# Patient Record
Sex: Male | Born: 1965
Health system: Southern US, Community
[De-identification: ages and names within clinical notes are randomized; demographics above are authoritative.]

## PROBLEM LIST (undated history)

## (undated) DIAGNOSIS — G4730 Sleep apnea, unspecified: Secondary | ICD-10-CM

## (undated) DIAGNOSIS — R112 Nausea with vomiting, unspecified: Secondary | ICD-10-CM

## (undated) DIAGNOSIS — Z9889 Other specified postprocedural states: Secondary | ICD-10-CM

## (undated) DIAGNOSIS — T8859XA Other complications of anesthesia, initial encounter: Secondary | ICD-10-CM

## (undated) DIAGNOSIS — I10 Essential (primary) hypertension: Secondary | ICD-10-CM

## (undated) DIAGNOSIS — Z8489 Family history of other specified conditions: Secondary | ICD-10-CM

## (undated) DIAGNOSIS — N189 Chronic kidney disease, unspecified: Secondary | ICD-10-CM

## (undated) DIAGNOSIS — T4145XA Adverse effect of unspecified anesthetic, initial encounter: Secondary | ICD-10-CM

## (undated) DIAGNOSIS — R0789 Other chest pain: Secondary | ICD-10-CM

## (undated) HISTORY — DX: Sleep apnea, unspecified: G47.30

## (undated) HISTORY — PX: HAND SURGERY: SHX662

## (undated) HISTORY — DX: Essential (primary) hypertension: I10

## (undated) HISTORY — DX: Other chest pain: R07.89

---

## 2014-12-28 ENCOUNTER — Other Ambulatory Visit (INDEPENDENT_AMBULATORY_CARE_PROVIDER_SITE_OTHER): Payer: Self-pay

## 2014-12-28 DIAGNOSIS — Z01818 Encounter for other preprocedural examination: Secondary | ICD-10-CM

## 2014-12-28 DIAGNOSIS — G4733 Obstructive sleep apnea (adult) (pediatric): Secondary | ICD-10-CM

## 2014-12-28 DIAGNOSIS — I10 Essential (primary) hypertension: Secondary | ICD-10-CM

## 2014-12-28 DIAGNOSIS — K219 Gastro-esophageal reflux disease without esophagitis: Secondary | ICD-10-CM

## 2015-02-08 ENCOUNTER — Ambulatory Visit (HOSPITAL_COMMUNITY)
Admission: RE | Admit: 2015-02-08 | Discharge: 2015-02-08 | Disposition: A | Discharge status: Home / Self Care | Payer: BLUE CROSS/BLUE SHIELD | Source: Ambulatory Visit | Attending: General Surgery | Admitting: General Surgery

## 2015-02-08 ENCOUNTER — Other Ambulatory Visit: Payer: Self-pay

## 2015-02-08 DIAGNOSIS — R160 Hepatomegaly, not elsewhere classified: Secondary | ICD-10-CM | POA: Insufficient documentation

## 2015-02-08 DIAGNOSIS — Z01811 Encounter for preprocedural respiratory examination: Secondary | ICD-10-CM | POA: Insufficient documentation

## 2015-02-08 DIAGNOSIS — Z6841 Body Mass Index (BMI) 40.0 and over, adult: Secondary | ICD-10-CM | POA: Diagnosis not present

## 2015-02-09 ENCOUNTER — Other Ambulatory Visit: Payer: Self-pay | Admitting: General Surgery

## 2015-02-09 ENCOUNTER — Other Ambulatory Visit: Payer: Self-pay

## 2015-02-09 DIAGNOSIS — R160 Hepatomegaly, not elsewhere classified: Secondary | ICD-10-CM

## 2015-02-09 NOTE — Addendum Note (Signed)
Addended by: Glenna FellowsHOXWORTH, Amram Maya T on: 02/09/2015 09:53 AM   Modules accepted: Orders

## 2015-02-13 ENCOUNTER — Ambulatory Visit (HOSPITAL_COMMUNITY)
Admission: RE | Admit: 2015-02-13 | Discharge: 2015-02-13 | Disposition: A | Discharge status: Home / Self Care | Payer: BLUE CROSS/BLUE SHIELD | Source: Ambulatory Visit | Attending: General Surgery | Admitting: General Surgery

## 2015-02-13 ENCOUNTER — Encounter: Payer: BLUE CROSS/BLUE SHIELD | Attending: General Surgery | Admitting: Dietician

## 2015-02-13 ENCOUNTER — Encounter (HOSPITAL_COMMUNITY)
Admission: RE | Disposition: A | Discharge status: Home / Self Care | Payer: Self-pay | Source: Ambulatory Visit | Attending: General Surgery

## 2015-02-13 ENCOUNTER — Encounter: Payer: Self-pay | Admitting: Dietician

## 2015-02-13 DIAGNOSIS — Z713 Dietary counseling and surveillance: Secondary | ICD-10-CM | POA: Diagnosis not present

## 2015-02-13 DIAGNOSIS — Z6841 Body Mass Index (BMI) 40.0 and over, adult: Secondary | ICD-10-CM | POA: Insufficient documentation

## 2015-02-13 HISTORY — PX: BREATH TEK H PYLORI: SHX5422

## 2015-02-13 SURGERY — BREATH TEST, FOR HELICOBACTER PYLORI

## 2015-02-13 NOTE — Progress Notes (Signed)
   02/13/15 0837  BREATH TEK ASSESSMENT  Referring MD Dr Glenna FellowsBenjamin Hoxworth  Time of Last PO Intake 2200  Baseline Breath At: 0805  Pranactin Given At: 0807  Post-Dose Breath At: 0822  Sample 1 2.2%  Sample 2 2.2%  Test Negative

## 2015-02-13 NOTE — Progress Notes (Signed)
  Pre-Op Assessment Visit:  Pre-Operative RYGB Surgery  Medical Nutrition Therapy:  Appt start time: 1125   End time:  1210  Patient was seen on 02/13/15 for Pre-Operative Nutrition Assessment. [facility] on 02/13/15.   Preferred Learning Style:   No preference indicated   Learning Readiness:   Ready  Handouts given during visit include:  Pre-Op Goals Bariatric Surgery Protein Shakes   During the appointment today the following Pre-Op Goals were reviewed with the patient: Maintain or lose weight as instructed by your surgeon Make healthy food choices Begin to limit portion sizes Limited concentrated sugars and fried foods Keep fat/sugar in the single digits per serving on   food labels Practice CHEWING your food  (aim for 30 chews per bite or until applesauce consistency) Practice not drinking 15 minutes before, during, and 30 minutes after each meal/snack Avoid all carbonated beverages  Avoid/limit caffeinated beverages  Avoid all sugar-sweetened beverages Consume 3 meals per day; eat every 3-5 hours Make a list of non-food related activities Aim for 64-100 ounces of FLUID daily  Aim for at least 60-80 grams of PROTEIN daily Look for a liquid protein source that contain ?15 g protein and ?5 g carbohydrate  (ex: shakes, drinks, shots)  Patient-Centered Goals: -Feeling better overall -Less medications  Scale of 1-10: confidence (10) /importance (11)  Demonstrated degree of understanding via:  Teach Back  Teaching Method Utilized:  Visual Auditory Hands on  Barriers to learning/adherence to lifestyle change: none  Patient to call the [facility] to enroll in Pre-Op and Post-Op Nutrition Education when surgery date is scheduled.

## 2015-02-13 NOTE — Progress Notes (Signed)
Samples provided and patient instructed on proper use:  Premier protein shake (vanilla - qty 1) Lot#: [id number] Exp: 09/2015  Premier protein shake (chocolate - qty 1) Lot#: [id number] Exp: 08/2015  Premier protein shake (strawberry - qty 1) Lot#: [id number] Exp: 05/2015

## 2015-02-14 ENCOUNTER — Encounter (HOSPITAL_COMMUNITY): Payer: Self-pay | Admitting: General Surgery

## 2015-03-28 ENCOUNTER — Ambulatory Visit
Admission: RE | Admit: 2015-03-28 | Discharge: 2015-03-28 | Disposition: A | Discharge status: Home / Self Care | Payer: BLUE CROSS/BLUE SHIELD | Source: Ambulatory Visit | Attending: General Surgery | Admitting: General Surgery

## 2015-06-05 ENCOUNTER — Encounter: Payer: BLUE CROSS/BLUE SHIELD | Attending: General Surgery

## 2015-06-05 DIAGNOSIS — Z6841 Body Mass Index (BMI) 40.0 and over, adult: Secondary | ICD-10-CM | POA: Insufficient documentation

## 2015-06-05 DIAGNOSIS — Z713 Dietary counseling and surveillance: Secondary | ICD-10-CM | POA: Insufficient documentation

## 2015-06-05 NOTE — Progress Notes (Signed)
  Pre-Operative Nutrition Class:  Appt start time: 8599   End time:  1830.  Patient was seen on 06/05/2015 for Pre-Operative Bariatric Surgery Education at the [facility].   Surgery date:  Surgery type: RYGB Start weight at [facility]: 346 lbs on 02/13/15 Weight today: 353 lbs  TANITA  BODY COMP RESULTS  06/05/15   BMI (kg/m^2) 49.2   Fat Mass (lbs) 157   Fat Free Mass (lbs) 196   Total Body Water (lbs) 143.5   Samples given per MNT protocol. Patient educated on appropriate usage: Celebrate multivitamin chew (orange - qty 1) Lot #: [id number] Exp: 12/2016  Premier protein shake (chocolate - qty 1) Lot #: [id number] Exp: 01/2016  Renee Pain Protein Powder (unflavored - qty 1) Lot #: [id number] Exp: 04/2016  The following the learning objectives were met by the patient during this course:  Identify Pre-Op Dietary Goals and will begin 2 weeks pre-operatively  Identify appropriate sources of fluids and proteins   State protein recommendations and appropriate sources pre and post-operatively  Identify Post-Operative Dietary Goals and will follow for 2 weeks post-operatively  Identify appropriate multivitamin and calcium sources  Describe the need for physical activity post-operatively and will follow MD recommendations  State when to call healthcare provider regarding medication questions or post-operative complications  Handouts given during class include:  Pre-Op Bariatric Surgery Diet Handout  Protein Shake Handout  Post-Op Bariatric Surgery Nutrition Handout  BELT Program Information Flyer  Support Group Information Flyer  [facility] Bariatric Supplements Price List  Follow-Up Plan: Patient will follow-up at [facility] 2 weeks post operatively for diet advancement per MD.

## 2015-06-16 ENCOUNTER — Other Ambulatory Visit: Payer: Self-pay | Admitting: General Surgery

## 2015-06-26 ENCOUNTER — Other Ambulatory Visit: Payer: Self-pay | Admitting: General Surgery

## 2015-06-28 ENCOUNTER — Other Ambulatory Visit (HOSPITAL_COMMUNITY): Payer: Self-pay | Admitting: *Deleted

## 2015-06-28 NOTE — Progress Notes (Signed)
Chest xray 02-08-15 epic

## 2015-06-28 NOTE — Patient Instructions (Addendum)
James Patton  06/28/2015   Your procedure is scheduled on: Tuesday sept 6, 2016  Report to [facility]  Entrance take [facility]  elevators to 3rd floor to  Short Stay Center at 530 AM.  Call this number if you have problems the morning of surgery [phone]   Remember: ONLY 1 PERSON MAY GO WITH YOU TO SHORT STAY TO GET  READY MORNING OF YOUR SURGERY.  Do not eat food or drink liquids :After Midnight.  Bring cpap mask and tubing   Take these medicines the morning of surgery with A SIP OF WATER: Amlodipine, (Norvasc), Labetalol (Normodine)                               You may not have any metal on your body including hair pins and              piercings  Do not wear jewelry, make-up, lotions, powders or perfumes, deodorant             Do not wear nail polish.  Do not shave  48 hours prior to surgery.              Men may shave face and neck.   Do not bring valuables to the hospital. [place] IS NOT             RESPONSIBLE   FOR VALUABLES.  Contacts, dentures or bridgework may not be worn into surgery.  Leave suitcase in the car. After surgery it may be brought to your room.     Patients discharged the day of surgery will not be allowed to drive home.  Name and phone number of your driver:  Special Instructions: N/A              Please read over the following fact sheets you were given: _____________________________________________________________________             [facility] - Preparing for Surgery Before surgery, you can play an important role.  Because skin is not sterile, your skin needs to be as free of germs as possible.  You can reduce the number of germs on your skin by washing with CHG (chlorahexidine gluconate) soap before surgery.  CHG is an antiseptic cleaner which kills germs and bonds with the skin to continue killing germs even after washing. Please DO NOT use if you have an allergy to CHG or antibacterial soaps.  If your skin becomes  reddened/irritated stop using the CHG and inform your nurse when you arrive at Short Stay. Do not shave (including legs and underarms) for at least 48 hours prior to the first CHG shower.  You may shave your face/neck. Please follow these instructions carefully:  1.  Shower with CHG Soap the night before surgery and the  morning of Surgery.  2.  If you choose to wash your hair, wash your hair first as usual with your  normal  shampoo.  3.  After you shampoo, rinse your hair and body thoroughly to remove the  shampoo.                           4.  Use CHG as you would any other liquid soap.  You can apply chg directly  to the skin and wash  Gently with a scrungie or clean washcloth.  5.  Apply the CHG Soap to your body ONLY FROM THE NECK DOWN.   Do not use on face/ open                           Wound or open sores. Avoid contact with eyes, ears mouth and genitals (private parts).                       Wash face,  Genitals (private parts) with your normal soap.             6.  Wash thoroughly, paying special attention to the area where your surgery  will be performed.  7.  Thoroughly rinse your body with warm water from the neck down.  8.  DO NOT shower/wash with your normal soap after using and rinsing off  the CHG Soap.                9.  Pat yourself dry with a clean towel.            10.  Wear clean pajamas.            11.  Place clean sheets on your bed the night of your first shower and do not  sleep with pets. Day of Surgery : Do not apply any lotions/deodorants the morning of surgery.  Please wear clean clothes to the hospital/surgery center.  FAILURE TO FOLLOW THESE INSTRUCTIONS MAY RESULT IN THE CANCELLATION OF YOUR SURGERY PATIENT SIGNATURE_________________________________  NURSE SIGNATURE__________________________________  ________________________________________________________________________

## 2015-06-28 NOTE — Progress Notes (Signed)
Spoke with dr Leta Jungling and made aware ekg 02-08-15 results, order received to repeat ekg with pre op 06-29-15.

## 2015-06-29 ENCOUNTER — Encounter (HOSPITAL_COMMUNITY): Payer: Self-pay

## 2015-06-29 ENCOUNTER — Encounter (HOSPITAL_COMMUNITY)
Admission: RE | Admit: 2015-06-29 | Discharge: 2015-06-29 | Disposition: A | Discharge status: Home / Self Care | Payer: BLUE CROSS/BLUE SHIELD | Source: Ambulatory Visit | Attending: General Surgery | Admitting: General Surgery

## 2015-06-29 DIAGNOSIS — Z01810 Encounter for preprocedural cardiovascular examination: Secondary | ICD-10-CM | POA: Diagnosis present

## 2015-06-29 DIAGNOSIS — Z6841 Body Mass Index (BMI) 40.0 and over, adult: Secondary | ICD-10-CM | POA: Insufficient documentation

## 2015-06-29 DIAGNOSIS — Z01812 Encounter for preprocedural laboratory examination: Secondary | ICD-10-CM | POA: Diagnosis present

## 2015-06-29 HISTORY — DX: Other complications of anesthesia, initial encounter: T88.59XA

## 2015-06-29 HISTORY — DX: Nausea with vomiting, unspecified: Z98.890

## 2015-06-29 HISTORY — DX: Other specified postprocedural states: R11.2

## 2015-06-29 HISTORY — DX: Chronic kidney disease, unspecified: N18.9

## 2015-06-29 HISTORY — DX: Adverse effect of unspecified anesthetic, initial encounter: T41.45XA

## 2015-06-29 HISTORY — DX: Family history of other specified conditions: Z84.89

## 2015-06-29 LAB — CBC WITH DIFFERENTIAL/PLATELET
Basophils Absolute: 0 10*3/uL (ref 0.0–0.1)
Basophils Relative: 0 % (ref 0–1)
Eosinophils Absolute: 0.2 10*3/uL (ref 0.0–0.7)
Eosinophils Relative: 3 % (ref 0–5)
HCT: 49.5 % (ref 39.0–52.0)
Hemoglobin: 16.7 g/dL (ref 13.0–17.0)
Lymphocytes Relative: 38 % (ref 12–46)
Lymphs Abs: 2.8 10*3/uL (ref 0.7–4.0)
MCH: 31.5 pg (ref 26.0–34.0)
MCHC: 33.7 g/dL (ref 30.0–36.0)
MCV: 93.2 fL (ref 78.0–100.0)
Monocytes Absolute: 0.5 10*3/uL (ref 0.1–1.0)
Monocytes Relative: 7 % (ref 3–12)
Neutro Abs: 3.7 10*3/uL (ref 1.7–7.7)
Neutrophils Relative %: 52 % (ref 43–77)
Platelets: 288 10*3/uL (ref 150–400)
RBC: 5.31 MIL/uL (ref 4.22–5.81)
RDW: 14.5 % (ref 11.5–15.5)
WBC: 7.2 10*3/uL (ref 4.0–10.5)

## 2015-06-29 LAB — COMPREHENSIVE METABOLIC PANEL
ALT: 19 U/L (ref 17–63)
AST: 27 U/L (ref 15–41)
Albumin: 4.2 g/dL (ref 3.5–5.0)
Alkaline Phosphatase: 51 U/L (ref 38–126)
Anion gap: 8 (ref 5–15)
BUN: 27 mg/dL — ABNORMAL HIGH (ref 6–20)
CO2: 28 mmol/L (ref 22–32)
Calcium: 9.3 mg/dL (ref 8.9–10.3)
Chloride: 103 mmol/L (ref 101–111)
Creatinine, Ser: 1.68 mg/dL — ABNORMAL HIGH (ref 0.61–1.24)
GFR calc Af Amer: 54 mL/min — ABNORMAL LOW (ref >60)
GFR calc non Af Amer: 47 mL/min — ABNORMAL LOW (ref >60)
Glucose, Bld: 91 mg/dL (ref 65–99)
Potassium: 3.6 mmol/L (ref 3.5–5.1)
Sodium: 139 mmol/L (ref 135–145)
Total Bilirubin: 0.7 mg/dL (ref 0.3–1.2)
Total Protein: 7.6 g/dL (ref 6.5–8.1)

## 2015-06-30 NOTE — Progress Notes (Signed)
Made dr Acey Lav aware ekg results from 06-29-15 and 02-08-15, pt ekg ok for surgery 07-04-15 per dr Acey Lav

## 2015-07-04 ENCOUNTER — Encounter (HOSPITAL_COMMUNITY)
Admission: AD | Disposition: A | Discharge status: Home / Self Care | Payer: Self-pay | Source: Ambulatory Visit | Attending: General Surgery

## 2015-07-04 ENCOUNTER — Inpatient Hospital Stay (HOSPITAL_COMMUNITY)
Admission: AD | Admit: 2015-07-04 | Discharge: 2015-07-06 | DRG: 621 | Disposition: A | Discharge status: Home / Self Care | Payer: BLUE CROSS/BLUE SHIELD | Source: Ambulatory Visit | Attending: General Surgery | Admitting: General Surgery

## 2015-07-04 ENCOUNTER — Inpatient Hospital Stay: Admit: 2015-07-04 | Payer: Self-pay | Admitting: General Surgery

## 2015-07-04 ENCOUNTER — Inpatient Hospital Stay (HOSPITAL_COMMUNITY): Payer: BLUE CROSS/BLUE SHIELD | Admitting: Certified Registered Nurse Anesthetist

## 2015-07-04 ENCOUNTER — Encounter (HOSPITAL_COMMUNITY): Payer: Self-pay | Admitting: *Deleted

## 2015-07-04 DIAGNOSIS — K219 Gastro-esophageal reflux disease without esophagitis: Secondary | ICD-10-CM | POA: Diagnosis present

## 2015-07-04 DIAGNOSIS — K449 Diaphragmatic hernia without obstruction or gangrene: Secondary | ICD-10-CM | POA: Diagnosis present

## 2015-07-04 DIAGNOSIS — Z01812 Encounter for preprocedural laboratory examination: Secondary | ICD-10-CM | POA: Diagnosis not present

## 2015-07-04 DIAGNOSIS — G4733 Obstructive sleep apnea (adult) (pediatric): Secondary | ICD-10-CM | POA: Diagnosis present

## 2015-07-04 DIAGNOSIS — I10 Essential (primary) hypertension: Secondary | ICD-10-CM | POA: Diagnosis present

## 2015-07-04 DIAGNOSIS — F40240 Claustrophobia: Secondary | ICD-10-CM | POA: Diagnosis present

## 2015-07-04 DIAGNOSIS — Z6841 Body Mass Index (BMI) 40.0 and over, adult: Secondary | ICD-10-CM | POA: Diagnosis not present

## 2015-07-04 DIAGNOSIS — Z79899 Other long term (current) drug therapy: Secondary | ICD-10-CM

## 2015-07-04 HISTORY — PX: LAPAROSCOPIC GASTRIC SLEEVE RESECTION: SHX5895

## 2015-07-04 LAB — HEMOGLOBIN AND HEMATOCRIT, BLOOD
HCT: 52.7 % — ABNORMAL HIGH (ref 39.0–52.0)
Hemoglobin: 18.1 g/dL — ABNORMAL HIGH (ref 13.0–17.0)

## 2015-07-04 SURGERY — LAPAROSCOPIC ROUX-EN-Y GASTRIC BYPASS WITH UPPER ENDOSCOPY
Anesthesia: General

## 2015-07-04 SURGERY — GASTRECTOMY, SLEEVE, LAPAROSCOPIC
Anesthesia: General | Site: Abdomen

## 2015-07-04 MED ORDER — BUPIVACAINE-EPINEPHRINE (PF) 0.25% -1:200000 IJ SOLN
INTRAMUSCULAR | Status: AC
  Filled 2015-07-04: qty 30

## 2015-07-04 MED ORDER — LACTATED RINGERS IV SOLN: INTRAVENOUS | Status: DC

## 2015-07-04 MED ORDER — METOPROLOL TARTRATE 1 MG/ML IV SOLN
10.0000 mg | Freq: Four times a day (QID) | INTRAVENOUS | Status: DC
  Administered 2015-07-04 – 2015-07-06 (×8): 10 mg via INTRAVENOUS
  Filled 2015-07-04 (×15): qty 10

## 2015-07-04 MED ORDER — EVICEL 5 ML EX KIT
PACK | Freq: Once | CUTANEOUS | Status: AC
  Administered 2015-07-04: 5 mL
  Filled 2015-07-04: qty 1

## 2015-07-04 MED ORDER — GLYCOPYRROLATE 0.2 MG/ML IJ SOLN
INTRAMUSCULAR | Status: AC
  Filled 2015-07-04: qty 3

## 2015-07-04 MED ORDER — LEVOFLOXACIN IN D5W 750 MG/150ML IV SOLN
750.0000 mg | INTRAVENOUS | Status: AC
  Administered 2015-07-04: 750 mg via INTRAVENOUS
  Filled 2015-07-04: qty 150

## 2015-07-04 MED ORDER — MEPERIDINE HCL 50 MG/ML IJ SOLN: 6.2500 mg | INTRAMUSCULAR | Status: DC | PRN

## 2015-07-04 MED ORDER — MORPHINE SULFATE (PF) 2 MG/ML IV SOLN
2.0000 mg | INTRAVENOUS | Status: DC | PRN
  Administered 2015-07-04 (×4): 2 mg via INTRAVENOUS
  Administered 2015-07-04 – 2015-07-05 (×3): 4 mg via INTRAVENOUS
  Filled 2015-07-04: qty 1
  Filled 2015-07-04: qty 2
  Filled 2015-07-04: qty 1
  Filled 2015-07-04 (×3): qty 2

## 2015-07-04 MED ORDER — PROPOFOL 10 MG/ML IV BOLUS
INTRAVENOUS | Status: AC
  Filled 2015-07-04: qty 20

## 2015-07-04 MED ORDER — ACETAMINOPHEN 160 MG/5ML PO SOLN: 325.0000 mg | ORAL | Status: DC | PRN

## 2015-07-04 MED ORDER — LACTATED RINGERS IR SOLN
Status: DC | PRN
  Administered 2015-07-04: 1000 mL

## 2015-07-04 MED ORDER — ACETAMINOPHEN 160 MG/5ML PO SOLN: 650.0000 mg | ORAL | Status: DC | PRN

## 2015-07-04 MED ORDER — SODIUM CHLORIDE 0.9 % IJ SOLN
INTRAMUSCULAR | Status: AC
  Filled 2015-07-04: qty 20

## 2015-07-04 MED ORDER — ACETAMINOPHEN 10 MG/ML IV SOLN
1000.0000 mg | Freq: Four times a day (QID) | INTRAVENOUS | Status: AC
  Administered 2015-07-04 – 2015-07-05 (×4): 1000 mg via INTRAVENOUS
  Filled 2015-07-04 (×5): qty 100

## 2015-07-04 MED ORDER — ONDANSETRON HCL 4 MG/2ML IJ SOLN
INTRAMUSCULAR | Status: DC | PRN
  Administered 2015-07-04: 4 mg via INTRAVENOUS

## 2015-07-04 MED ORDER — GLYCOPYRROLATE 0.2 MG/ML IJ SOLN
INTRAMUSCULAR | Status: DC | PRN
  Administered 2015-07-04: .8 mg via INTRAVENOUS

## 2015-07-04 MED ORDER — UNJURY CHICKEN SOUP POWDER: 2.0000 [oz_av] | Freq: Four times a day (QID) | ORAL | Status: DC

## 2015-07-04 MED ORDER — UNJURY VANILLA POWDER: 2.0000 [oz_av] | Freq: Four times a day (QID) | ORAL | Status: DC

## 2015-07-04 MED ORDER — ROCURONIUM BROMIDE 100 MG/10ML IV SOLN
INTRAVENOUS | Status: DC | PRN
  Administered 2015-07-04 (×2): 10 mg via INTRAVENOUS
  Administered 2015-07-04: 50 mg via INTRAVENOUS

## 2015-07-04 MED ORDER — CHLORHEXIDINE GLUCONATE CLOTH 2 % EX PADS: 6.0000 | MEDICATED_PAD | Freq: Once | CUTANEOUS | Status: DC

## 2015-07-04 MED ORDER — 0.9 % SODIUM CHLORIDE (POUR BTL) OPTIME
TOPICAL | Status: DC | PRN
  Administered 2015-07-04: 1000 mL

## 2015-07-04 MED ORDER — LIDOCAINE HCL (CARDIAC) 20 MG/ML IV SOLN
INTRAVENOUS | Status: DC | PRN
Start: 2015-07-04 — End: 2015-07-04
  Administered 2015-07-04: 100 mg via INTRAVENOUS

## 2015-07-04 MED ORDER — LIDOCAINE HCL (CARDIAC) 20 MG/ML IV SOLN
INTRAVENOUS | Status: AC
  Filled 2015-07-04: qty 5

## 2015-07-04 MED ORDER — LOTEPREDNOL ETABONATE 0.5 % OP GEL
1.0000 "application " | Freq: Every day | OPHTHALMIC | Status: DC
  Administered 2015-07-06: 1 via OPHTHALMIC

## 2015-07-04 MED ORDER — ONDANSETRON HCL 4 MG/2ML IJ SOLN
INTRAMUSCULAR | Status: AC
  Filled 2015-07-04: qty 2

## 2015-07-04 MED ORDER — NEOSTIGMINE METHYLSULFATE 10 MG/10ML IV SOLN
INTRAVENOUS | Status: DC | PRN
  Administered 2015-07-04: 5 mg via INTRAVENOUS

## 2015-07-04 MED ORDER — EPHEDRINE SULFATE 50 MG/ML IJ SOLN
INTRAMUSCULAR | Status: AC
  Filled 2015-07-04: qty 1

## 2015-07-04 MED ORDER — FENTANYL CITRATE (PF) 250 MCG/5ML IJ SOLN
INTRAMUSCULAR | Status: AC
  Filled 2015-07-04: qty 25

## 2015-07-04 MED ORDER — LACTATED RINGERS IV SOLN
INTRAVENOUS | Status: DC | PRN
  Administered 2015-07-04 (×2): via INTRAVENOUS

## 2015-07-04 MED ORDER — PANTOPRAZOLE SODIUM 40 MG IV SOLR
40.0000 mg | Freq: Every day | INTRAVENOUS | Status: DC
  Administered 2015-07-04 – 2015-07-05 (×2): 40 mg via INTRAVENOUS
  Filled 2015-07-04 (×3): qty 40

## 2015-07-04 MED ORDER — BUPIVACAINE-EPINEPHRINE 0.25% -1:200000 IJ SOLN
INTRAMUSCULAR | Status: DC | PRN
  Administered 2015-07-04: 29 mL

## 2015-07-04 MED ORDER — ROCURONIUM BROMIDE 100 MG/10ML IV SOLN
INTRAVENOUS | Status: AC
  Filled 2015-07-04: qty 1

## 2015-07-04 MED ORDER — ENOXAPARIN SODIUM 30 MG/0.3ML ~~LOC~~ SOLN
30.0000 mg | Freq: Two times a day (BID) | SUBCUTANEOUS | Status: DC
  Administered 2015-07-05 – 2015-07-06 (×3): 30 mg via SUBCUTANEOUS
  Filled 2015-07-04 (×5): qty 0.3

## 2015-07-04 MED ORDER — SUCCINYLCHOLINE CHLORIDE 20 MG/ML IJ SOLN
INTRAMUSCULAR | Status: DC | PRN
  Administered 2015-07-04: 160 mg via INTRAVENOUS

## 2015-07-04 MED ORDER — FENTANYL CITRATE (PF) 100 MCG/2ML IJ SOLN: 25.0000 ug | INTRAMUSCULAR | Status: DC | PRN

## 2015-07-04 MED ORDER — OXYCODONE HCL 5 MG/5ML PO SOLN
5.0000 mg | ORAL | Status: DC | PRN
  Administered 2015-07-05: 5 mg via ORAL
  Administered 2015-07-05 – 2015-07-06 (×2): 10 mg via ORAL
  Filled 2015-07-04: qty 10
  Filled 2015-07-04: qty 5
  Filled 2015-07-04: qty 10

## 2015-07-04 MED ORDER — UNJURY CHOCOLATE CLASSIC POWDER
2.0000 [oz_av] | Freq: Four times a day (QID) | ORAL | Status: DC
  Administered 2015-07-06: 2 [oz_av] via ORAL

## 2015-07-04 MED ORDER — MIDAZOLAM HCL 5 MG/5ML IJ SOLN
INTRAMUSCULAR | Status: DC | PRN
  Administered 2015-07-04: 2 mg via INTRAVENOUS

## 2015-07-04 MED ORDER — SODIUM CHLORIDE 0.9 % IJ SOLN
INTRAMUSCULAR | Status: DC | PRN
  Administered 2015-07-04: 20 mL

## 2015-07-04 MED ORDER — PROMETHAZINE HCL 25 MG/ML IJ SOLN: 6.2500 mg | INTRAMUSCULAR | Status: DC | PRN

## 2015-07-04 MED ORDER — TISSEEL VH 10 ML EX KIT
PACK | CUTANEOUS | Status: AC
  Filled 2015-07-04: qty 1

## 2015-07-04 MED ORDER — FENTANYL CITRATE (PF) 100 MCG/2ML IJ SOLN
INTRAMUSCULAR | Status: DC | PRN
  Administered 2015-07-04 (×5): 50 ug via INTRAVENOUS

## 2015-07-04 MED ORDER — ONDANSETRON HCL 4 MG/2ML IJ SOLN: 4.0000 mg | INTRAMUSCULAR | Status: DC | PRN

## 2015-07-04 MED ORDER — MIDAZOLAM HCL 2 MG/2ML IJ SOLN
INTRAMUSCULAR | Status: AC
  Filled 2015-07-04: qty 4

## 2015-07-04 MED ORDER — PROPOFOL 10 MG/ML IV BOLUS
INTRAVENOUS | Status: DC | PRN
  Administered 2015-07-04: 250 mg via INTRAVENOUS

## 2015-07-04 MED ORDER — NEOSTIGMINE METHYLSULFATE 10 MG/10ML IV SOLN
INTRAVENOUS | Status: AC
  Filled 2015-07-04: qty 1

## 2015-07-04 MED ORDER — ATROPINE SULFATE 0.4 MG/ML IJ SOLN
INTRAMUSCULAR | Status: AC
  Filled 2015-07-04: qty 1

## 2015-07-04 MED ORDER — PHENYLEPHRINE 40 MCG/ML (10ML) SYRINGE FOR IV PUSH (FOR BLOOD PRESSURE SUPPORT)
PREFILLED_SYRINGE | INTRAVENOUS | Status: AC
  Filled 2015-07-04: qty 10

## 2015-07-04 MED ORDER — DEXAMETHASONE SODIUM PHOSPHATE 10 MG/ML IJ SOLN
INTRAMUSCULAR | Status: AC
  Filled 2015-07-04: qty 1

## 2015-07-04 MED ORDER — HYDRALAZINE HCL 20 MG/ML IJ SOLN
20.0000 mg | INTRAMUSCULAR | Status: DC | PRN
  Administered 2015-07-04 – 2015-07-05 (×3): 20 mg via INTRAVENOUS
  Filled 2015-07-04 (×3): qty 1

## 2015-07-04 MED ORDER — SODIUM CHLORIDE 0.9 % IJ SOLN
INTRAMUSCULAR | Status: AC
  Filled 2015-07-04: qty 10

## 2015-07-04 MED ORDER — BUPIVACAINE LIPOSOME 1.3 % IJ SUSP
20.0000 mL | Freq: Once | INTRAMUSCULAR | Status: AC
  Administered 2015-07-04: 20 mL
  Filled 2015-07-04: qty 20

## 2015-07-04 MED ORDER — OXYCODONE HCL 5 MG/5ML PO SOLN: 5.0000 mg | ORAL | Status: DC | PRN

## 2015-07-04 MED ORDER — DEXAMETHASONE SODIUM PHOSPHATE 10 MG/ML IJ SOLN
INTRAMUSCULAR | Status: DC | PRN
  Administered 2015-07-04: 10 mg via INTRAVENOUS

## 2015-07-04 MED ORDER — HEPARIN SODIUM (PORCINE) 5000 UNIT/ML IJ SOLN
5000.0000 [IU] | INTRAMUSCULAR | Status: AC
  Administered 2015-07-04: 5000 [IU] via SUBCUTANEOUS
  Filled 2015-07-04: qty 1

## 2015-07-04 MED ORDER — PHENYLEPHRINE HCL 10 MG/ML IJ SOLN
INTRAMUSCULAR | Status: DC | PRN
  Administered 2015-07-04: 80 ug via INTRAVENOUS
  Administered 2015-07-04: 120 ug via INTRAVENOUS
  Administered 2015-07-04 (×2): 80 ug via INTRAVENOUS

## 2015-07-04 MED ORDER — KCL IN DEXTROSE-NACL 20-5-0.45 MEQ/L-%-% IV SOLN
INTRAVENOUS | Status: DC
  Administered 2015-07-04 – 2015-07-06 (×5): via INTRAVENOUS
  Filled 2015-07-04 (×7): qty 1000

## 2015-07-04 SURGICAL SUPPLY — 54 items
APPLICATOR COTTON TIP 6IN STRL (MISCELLANEOUS) IMPLANT
APPLIER CLIP ROT 10 11.4 M/L (STAPLE)
APPLIER CLIP ROT 13.4 12 LRG (CLIP)
BLADE SURG SZ11 CARB STEEL (BLADE) ×2 IMPLANT
CABLE HIGH FREQUENCY MONO STRZ (ELECTRODE) ×2 IMPLANT
CHLORAPREP W/TINT 26ML (MISCELLANEOUS) ×4 IMPLANT
CLIP APPLIE ROT 10 11.4 M/L (STAPLE) IMPLANT
CLIP APPLIE ROT 13.4 12 LRG (CLIP) IMPLANT
COVER SURGICAL LIGHT HANDLE (MISCELLANEOUS) ×2 IMPLANT
DEVICE SUTURE ENDOST 10MM (ENDOMECHANICALS) ×2 IMPLANT
DEVICE TROCAR PUNCTURE CLOSURE (ENDOMECHANICALS) ×2 IMPLANT
DRAPE CAMERA CLOSED 9X96 (DRAPES) ×2 IMPLANT
DRAPE UTILITY XL STRL (DRAPES) ×4 IMPLANT
ELECT REM PT RETURN 9FT ADLT (ELECTROSURGICAL) ×2
ELECTRODE REM PT RTRN 9FT ADLT (ELECTROSURGICAL) ×1 IMPLANT
GAUZE SPONGE 4X4 12PLY STRL (GAUZE/BANDAGES/DRESSINGS) IMPLANT
GLOVE BIOGEL PI IND STRL 7.5 (GLOVE) ×1 IMPLANT
GLOVE BIOGEL PI INDICATOR 7.5 (GLOVE) ×1
GLOVE ECLIPSE 7.5 STRL STRAW (GLOVE) ×2 IMPLANT
GOWN STRL REUS W/TWL XL LVL3 (GOWN DISPOSABLE) ×10 IMPLANT
HOVERMATT SINGLE USE (MISCELLANEOUS) ×2 IMPLANT
KIT BASIN OR (CUSTOM PROCEDURE TRAY) ×2 IMPLANT
LIQUID BAND (GAUZE/BANDAGES/DRESSINGS) ×2 IMPLANT
NEEDLE SPNL 22GX3.5 QUINCKE BK (NEEDLE) ×2 IMPLANT
PACK UNIVERSAL I (CUSTOM PROCEDURE TRAY) ×2 IMPLANT
PEN SKIN MARKING BROAD (MISCELLANEOUS) ×2 IMPLANT
POUCH SPECIMEN RETRIEVAL 10MM (ENDOMECHANICALS) IMPLANT
RELOAD STAPLER BLUE 60MM (STAPLE) ×2 IMPLANT
RELOAD STAPLER GOLD 60MM (STAPLE) ×1 IMPLANT
RELOAD STAPLER GREEN 60MM (STAPLE) ×3 IMPLANT
SCISSORS LAP 5X45 EPIX DISP (ENDOMECHANICALS) ×2 IMPLANT
SEALANT SURGICAL APPL DUAL CAN (MISCELLANEOUS) ×2 IMPLANT
SET IRRIG TUBING LAPAROSCOPIC (IRRIGATION / IRRIGATOR) ×2 IMPLANT
SHEARS CURVED HARMONIC AC 45CM (MISCELLANEOUS) ×2 IMPLANT
SLEEVE ADV FIXATION 5X100MM (TROCAR) ×2 IMPLANT
SLEEVE GASTRECTOMY 36FR VISIGI (MISCELLANEOUS) ×2 IMPLANT
SOLUTION ANTI FOG 6CC (MISCELLANEOUS) ×2 IMPLANT
SPONGE LAP 18X18 X RAY DECT (DISPOSABLE) ×2 IMPLANT
STAPLER ECHELON LONG 60 440 (INSTRUMENTS) ×2 IMPLANT
STAPLER RELOAD BLUE 60MM (STAPLE) ×4
STAPLER RELOAD GOLD 60MM (STAPLE) ×2
STAPLER RELOAD GREEN 60MM (STAPLE) ×6
SUT MNCRL AB 4-0 PS2 18 (SUTURE) ×2 IMPLANT
SUT VICRYL 0 TIES 12 18 (SUTURE) ×2 IMPLANT
SYR 10ML ECCENTRIC (SYRINGE) ×2 IMPLANT
SYR 20CC LL (SYRINGE) ×2 IMPLANT
TOWEL OR 17X26 10 PK STRL BLUE (TOWEL DISPOSABLE) ×2 IMPLANT
TOWEL OR NON WOVEN STRL DISP B (DISPOSABLE) ×2 IMPLANT
TROCAR ADV FIXATION 5X100MM (TROCAR) ×2 IMPLANT
TROCAR BLADELESS 15MM (ENDOMECHANICALS) ×2 IMPLANT
TROCAR XCEL NON-BLD 5MMX100MML (ENDOMECHANICALS) ×2 IMPLANT
TUBING CONNECTING 10 (TUBING) ×4 IMPLANT
TUBING ENDO SMARTCAP PENTAX (MISCELLANEOUS) ×2 IMPLANT
TUBING FILTER THERMOFLATOR (ELECTROSURGICAL) ×2 IMPLANT

## 2015-07-04 NOTE — H&P (Signed)
History of Present Illness James [name] MD; 06/28/2015 3:49 PM) The patient is a 49 year old male who presents with obesity. He returns for his preoperative visit prior to planned laparoscopic sleeve gastrectomy. The patient gives a history of progressive obesity since childhood despite multiple attempts at medical management. He has been able to lose up to 60 pounds at a time and feels significantly better but then experiences progressive weight gain. Obesity has been affecting the patient in a number of ways including worsening fatigue and difficulty with routine activities. Significant co-morbid illnesses have developed including obstructive sleep apnea, hypertension which has become somewhat more difficult to control and occasional GERD not requiring prescription medications.. The patient has been to our initial information seminar, researched surgical options thoroughly, and initially decided on gastric bypass as his wife has had good success. However after our discussion in the office he researched further and has decided to switch to sleeve gastrectomy due to the relative simplicity and slightly lower complication rate. I previously discussed with him the change and I think this is perfectly acceptable and I expect he would have good success with either operation.   Allergies [name], CMA; 06/28/2015 3:28 PM) Penicillin V Potassium *PENICILLINS*  Medication History [name], CMA; 06/28/2015 3:28 PM) OxyCODONE HCl (5MG /5ML Solution, 5-10 Milliliter Oral every four hours, as needed, Taken starting 06/28/2015) Active. Protonix (40MG  Tablet DR, 1 (one) Tablet DR Oral daily, Taken starting 06/28/2015) Active. Ondansetron (4MG  Tablet Disperse, 1 (one) Tablet Disperse Oral every six hours, as needed, Taken starting 06/28/2015) Active. AmLODIPine Besylate (5MG  Tablet, Oral) Active. Benazepril HCl (40MG  Tablet, Oral) Active. Cialis (20MG  Tablet, Oral) Active. Losartan  Potassium (100MG  Tablet, Oral) Active. Lotemax (0.5% Gel, Ophthalmic) Active. Neomycin-Polymyxin-HC (3.5-10000-1 Suspension, Otic) Active. Medications Reconciled  Vitals [name] CMA; 06/28/2015 3:28 PM) 06/28/2015 3:28 PM Weight: 350 lb Height: 71in Body Surface Area: 2.82 m Body Mass Index: 48.81 kg/m Temp.: 98.43F(Temporal)  Pulse: 88 (Regular)  BP: 138/88 (Sitting, Left Arm, Standard)    Physical Exam James Salina T. Lenea Bywater MD; 06/28/2015 3:50 PM) The physical exam findings are as follows: Note:General: Alert, morbidly obese African-American male, in no distress Skin: Warm and dry without rash or infection. HEENT: No palpable masses or thyromegaly. Sclera nonicteric. Lymph nodes: No cervical, supraclavicular, or inguinal nodes palpable. Lungs: Breath sounds clear and equal. No wheezing or increased work of breathing. Cardiovascular: Regular rate and rhythm without murmer. No JVD or edema. Peripheral pulses intact. No carotid bruits. Abdomen: Nondistended. Soft and nontender. No masses palpable. No organomegaly. No palpable hernias. Extremities: No edema or joint swelling or deformity. No chronic venous stasis changes. Neurologic: Alert and fully oriented. Gait normal. No focal weakness. Psychiatric: Normal mood and affect. Thought content appropriate with normal judgement and insight    Assessment & Plan James Salina T. Maciel Kegg MD; 06/28/2015 3:54 PM) MORBID OBESITY (278.01  E66.01) Impression: Patient with progressive morbid obesity unresponsive to multiple efforts at medical management who presents with a BMI of 49.9 and comorbidities of obstructive sleep apnea, hypertension and GERD. I believe there would be very significant medical benefit from surgical weight loss. After our discussion of surgical options he has decided to proceed with laparoscopic sleeve gastrectomy as noted above. We have discussed the nature of the problem and the risks of remaining  morbidly obese. We discussed laparoscopic sleeve gastrectomy in detail including the nature of the procedure, expected hospitalization and recovery, and major risks of anesthetic complications, bleeding, blood clots and pulmonary emboli, leakage and infection  and long-term risks of stricture, ulceration, bowel obstruction, nutritional deficiencies, worsening GERD, and failure to lose weight or weight regain. We discussed these problems could lead to death.  We reviewed the sleeve consent form line by line today and all his questions were answered. Preoperative ultrasound showed a fairly large, 8 cm liver mass, likely hemangioma and MRI was recommended. He was unable to tolerate this due to claustrophobia. He states however he did have a workup of this in [place] 2 years ago and was told it was benign. We will obtain these records but with this history I do not think this needs to delay his surgery.  I discussed with him that due to his mild GERD we would check intraoperatively for a possible hiatal hernia.

## 2015-07-04 NOTE — Transfer of Care (Signed)
Immediate Anesthesia Transfer of Care Note  Patient: James Patton  Procedure(s) Performed: Procedure(s): LAPAROSCOPIC GASTRIC SLEEVE RESECTION WITH HIATAL HERNIA REPAIR WITH UPPER ENDOSCOPY (N/A)  Patient Location: PACU  Anesthesia Type:General  Level of Consciousness:  sedated, patient cooperative and responds to stimulation  Airway & Oxygen Therapy:Patient Spontanous Breathing and Patient connected to face mask oxgen  Post-op Assessment:  Report given to PACU RN and Post -op Vital signs reviewed and stable  Post vital signs:  Reviewed and stable  Last Vitals:  Filed Vitals:   07/04/15 0959  BP: 166/100  Pulse: 70  Temp:   Resp: 18    Complications: No apparent anesthesia complications

## 2015-07-04 NOTE — Anesthesia Postprocedure Evaluation (Signed)
  Anesthesia Post-op Note  Patient: James Patton  Procedure(s) Performed: Procedure(s) (LRB): LAPAROSCOPIC GASTRIC SLEEVE RESECTION WITH HIATAL HERNIA REPAIR WITH UPPER ENDOSCOPY (N/A)  Patient Location: PACU  Anesthesia Type: General  Level of Consciousness: awake and alert   Airway and Oxygen Therapy: Patient Spontanous Breathing  Post-op Pain: mild  Post-op Assessment: Post-op Vital signs reviewed, Patient's Cardiovascular Status Stable, Respiratory Function Stable, Patent Airway and No signs of Nausea or vomiting  Last Vitals:  Filed Vitals:   07/04/15 1414  BP: 168/104  Pulse: 73  Temp: 36.7 C  Resp: 18    Post-op Vital Signs: stable   Complications: No apparent anesthesia complications

## 2015-07-04 NOTE — Op Note (Signed)
Preoperative Diagnosis: Morbid Obesity  Postoprative Diagnosis: Morbid Obesity  Procedure: Procedure(s): LAPAROSCOPIC GASTRIC SLEEVE RESECTION WITH HIATAL HERNIA REPAIR WITH UPPER ENDOSCOPY   Surgeon: Glenna Fellows T   Assistants: Feliciana Rossetti  Anesthesia:  General endotracheal anesthesia  Indications: Patient has a history of progressive morbid obesity unresponsive to medical management and presents at a BMI of 50 with comorbidities of obstructive sleep apnea, hypertension, GERD. After extensive preoperative workup and discussion detailed elsewhere we have elected proceed with laparoscopic sleeve gastrectomy for surgical treatment of his morbid obesity.    Procedure Detail:  Patient was brought to the operating room, placed in the supine position on the operating table, and general endotracheal anesthesia induced. He received preoperative IV antibiotics and subcutaneous heparin. PAS were in place. The abdomen was widely sterilely prepped and draped. Patient timeout was performed and correct procedure verified. Access was obtained with a 5 mm Optiview trocar in the left upper quadrant without difficulty and pneumoperitoneum established. Under direct vision a 5 mm trocar was placed laterally in the right upper quadrant, a 15 mm trocar in the right midabdomen at the base of the falciform ligament, a 5 mm trocar just above and to the left the umbilicus for the camera port and a 5 mm trocar laterally in the left upper quadrant. Through a 5 mm subxiphoid site the [facility] retractor was placed in the left lobe of the liver elevated with excellent exposure of the stomach and hiatus. Initially a sizing tube was passed orally into the stomach and with the balloon inflated to 10 mL were able to pull this back through the hiatus without resistance. He does have some reflux as well. I elected to proceed with hiatal hernia repair. The gastrohepatic omentum was opened with the Harmonic scalpel and the  right crus and anterior esophagus exposed. Peritoneum just anterior to the right crus was incised and careful blunt dissection was carried posteriorly to the esophagus. The esophagus and vagus nerve were bluntly dissected out and displaced anteriorly and there was seen to be a small but definite hiatal hernia. The crura were defined and a 0 Ethibond suture was placed posteriorly through the crura closing the hiatus back to a normal size. At this point the sizing tube was passed back down into the stomach and with 10 mL in the balloon and pulled up snugly against the hiatus. Following this we proceeded with the sleeve gastrectomy. 5 cm were measured proximally from the pylorus along the greater curve. Beginning here the lesser omentum was dissected just below the greater curve with the Harmonic scalpel and the lesser sac entered. Dissection continued proximally along the greater curve. Short gastric vessels were divided. The dissection progressed proximally and the fundus was dissected away from the spleen with Harmonic scalpel. Further posterior attachments were taken and the left crus completely exposed and dissection carried back down onto our hernia repair posteriorly until the stomach was completely freed along its lesser curve vasculature. A few filmy posterior attachments were taken down back toward the pylorus and we completely freed the greater curve to 5 cm from the pylorus. The VisiG gastric tube was passed orally and advanced with its tip down to the pylorus. It was positioned along the lesser curve of the stomach splayed out symmetrically and placed on suction. An initial firing of the green load 60 mm Escalon stapler was performed beginning at 5 cm from the pylorus and angling upward but staying away from the incisura. A second firing of the green load  stapler was performed up past the incisura with a little extra room around the tube at this point. Following this several firings of the gold load 60 mm  echelon stapler were performed along side of the tube working proximally with a final firing of the blue load stapler angling just outside of the esophageal fat pad and making sure there was no redundant fundus posteriorly completing the sleeve. The staple line was intact without bleeding. Under pressure from the VisiG G-tube inflated and irrigation no evidence of leak. The sleeve was desufflated and the VisiG tube removed. The sleeve appeared symmetrical with no narrowing or twisting. Upper endoscopy was then performed and there was no narrowing or bleeding and with the sleeve again insufflated under saline irrigation of leak. Evaseal Tissue seal was placed along the staple line. Trocar sites were infiltrated with Exparel local anesthesia. The fascial defect at the 15 mm trocar site was repaired with a 0 Vicryl placed via the Endo Close. The Nathanson retractor was removed under direct vision and all CO2 evacuated. Trochars removed. Skin incisions were closed with subcuticular 4-0 Monocryl and Dermabond. Sponge needle and instrument counts were correct.    Findings: As above  Estimated Blood Loss:  Minimal         Drains: none  Blood Given: none          Specimens: The greater curvature of stomach        Complications:  * No complications entered in OR log *         Disposition: PACU - hemodynamically stable.         Condition: stable

## 2015-07-04 NOTE — Anesthesia Procedure Notes (Addendum)
Procedure Name: Intubation Date/Time: 07/04/2015 7:41 AM Performed by: Epimenio Sarin Pre-anesthesia Checklist: Patient identified, Emergency Drugs available, Suction available, Patient being monitored and Timeout performed Patient Re-evaluated:Patient Re-evaluated prior to inductionOxygen Delivery Method: Circle system utilized Preoxygenation: Pre-oxygenation with 100% oxygen Intubation Type: IV induction Ventilation: Mask ventilation without difficulty and Oral airway inserted - appropriate to patient size Laryngoscope Size: Mac and 3 Grade View: Grade II Tube type: Oral Tube size: 7.5 mm Number of attempts: 1 Airway Equipment and Method: Stylet Placement Confirmation: ETT inserted through vocal cords under direct vision,  positive ETCO2 and breath sounds checked- equal and bilateral Secured at: 21 cm Tube secured with: Tape Dental Injury: Teeth and Oropharynx as per pre-operative assessment  Comments: Large tongue, pillows under shoulders for sniffing position. ETT easily passed through cords with slight downward laryngeal pressure.

## 2015-07-04 NOTE — Discharge Instructions (Signed)

## 2015-07-04 NOTE — Anesthesia Preprocedure Evaluation (Addendum)
Anesthesia Evaluation  Patient identified by MRN, date of birth, ID band Patient awake    Reviewed: Allergy & Precautions, NPO status , Patient's Chart, lab work & pertinent test results  History of Anesthesia Complications (+) PONV  Airway Mallampati: II  TM Distance: >3 FB Neck ROM: Full    Dental no notable dental hx.    Pulmonary neg pulmonary ROS, sleep apnea ,  breath sounds clear to auscultation  Pulmonary exam normal       Cardiovascular hypertension, Pt. on medications Normal cardiovascular examRhythm:Regular Rate:Normal     Neuro/Psych negative neurological ROS  negative psych ROS   GI/Hepatic negative GI ROS, Neg liver ROS,   Endo/Other  Morbid obesity  Renal/GU negative Renal ROS  negative genitourinary   Musculoskeletal negative musculoskeletal ROS (+)   Abdominal   Peds negative pediatric ROS (+)  Hematology negative hematology ROS (+)   Anesthesia Other Findings   Reproductive/Obstetrics negative OB ROS                            Anesthesia Physical Anesthesia Plan  ASA: III  Anesthesia Plan: General   Post-op Pain Management:    Induction: Intravenous  Airway Management Planned: Oral ETT  Additional Equipment:   Intra-op Plan:   Post-operative Plan: Extubation in OR  Informed Consent: I have reviewed the patients History and Physical, chart, labs and discussed the procedure including the risks, benefits and alternatives for the proposed anesthesia with the patient or authorized representative who has indicated his/her understanding and acceptance.   Dental advisory given  Plan Discussed with: CRNA  Anesthesia Plan Comments:         Anesthesia Quick Evaluation

## 2015-07-04 NOTE — Interval H&P Note (Signed)
History and Physical Interval Note:  07/04/2015 7:15 AM  James Patton  has presented today for surgery, with the diagnosis of Morbid Obesity  The various methods of treatment have been discussed with the patient and family. After consideration of risks, benefits and other options for treatment, the patient has consented to  Procedure(s): LAPAROSCOPIC GASTRIC SLEEVE RESECTION (N/A) as a surgical intervention .  The patient's history has been reviewed, patient examined, no change in status, stable for surgery.  I have reviewed the patient's chart and labs.  Questions were answered to the patient's satisfaction.     Euell Schiff T

## 2015-07-04 NOTE — Op Note (Signed)
Preoperative diagnosis: laparoscopic sleeve gastrectomy  Postoperative diagnosis: Same   Procedure: Upper endoscopy   Surgeon: Feliciana Rossetti, M.D.  Anesthesia: Gen.   Indications for procedure: This patient was undergoing a laparoscopic adjustable gastric restrictive procedure and laparoscopic sleeve gastrectomy.   Description of procedure: The endoscopy was placed in the mouth and into the oropharynx and under endoscopic vision it was advanced to the esophagogastric junction. The pouch was insufflated and no bleeding or bubbles were seen. The GEJ was identified at 48 cm from the teeth.  No bleeding or leaks were detected. The scope was withdrawn without difficulty.   Feliciana Rossetti, M.D. General, Bariatric, & Minimally Invasive Surgery [facility]

## 2015-07-05 LAB — CBC WITH DIFFERENTIAL/PLATELET
Basophils Absolute: 0 10*3/uL (ref 0.0–0.1)
Basophils Relative: 0 % (ref 0–1)
Eosinophils Absolute: 0 10*3/uL (ref 0.0–0.7)
Eosinophils Relative: 0 % (ref 0–5)
HCT: 49.2 % (ref 39.0–52.0)
Hemoglobin: 17 g/dL (ref 13.0–17.0)
Lymphocytes Relative: 11 % — ABNORMAL LOW (ref 12–46)
Lymphs Abs: 1.3 10*3/uL (ref 0.7–4.0)
MCH: 32.1 pg (ref 26.0–34.0)
MCHC: 34.6 g/dL (ref 30.0–36.0)
MCV: 92.8 fL (ref 78.0–100.0)
Monocytes Absolute: 1.2 10*3/uL — ABNORMAL HIGH (ref 0.1–1.0)
Monocytes Relative: 9 % (ref 3–12)
Neutro Abs: 9.9 10*3/uL — ABNORMAL HIGH (ref 1.7–7.7)
Neutrophils Relative %: 80 % — ABNORMAL HIGH (ref 43–77)
Platelets: 294 10*3/uL (ref 150–400)
RBC: 5.3 MIL/uL (ref 4.22–5.81)
RDW: 14.7 % (ref 11.5–15.5)
WBC: 12.3 10*3/uL — ABNORMAL HIGH (ref 4.0–10.5)

## 2015-07-05 LAB — HEMOGLOBIN AND HEMATOCRIT, BLOOD
HCT: 50.1 % (ref 39.0–52.0)
Hemoglobin: 16.9 g/dL (ref 13.0–17.0)

## 2015-07-05 NOTE — Progress Notes (Signed)
Patient ID: James Patton, male   DOB: [birth year], 49 y.o.   MRN: [id number] 1 Day Post-Op  Subjective: Denies pain, nausea or other complaints. Up walking in the hall  Objective: Vital signs in last 24 hours: Temp:  [97.8 F (36.6 C)-99.7 F (37.6 C)] 98.1 F (36.7 C) (09/07 0515) Pulse Rate:  [70-94] 84 (09/07 0515) Resp:  [16-19] 18 (09/07 0515) BP: (127-192)/(64-104) 129/80 mmHg (09/07 0515) SpO2:  [94 %-100 %] 98 % (09/07 0515) Last BM Date: 07/03/15  Intake/Output from previous day: 09/06 0701 - 09/07 0700 In: 2850 [I.V.:2750; IV Piggyback:100] Out: 1450 [Urine:1450] Intake/Output this shift: Total I/O In: 30 [P.O.:30] Out: -   General appearance: alert, cooperative and no distress GI: normal findings: soft, non-tender Incision/Wound: cclean and dry  Lab Results:   Recent Labs  07/04/15 1850 07/05/15 0412  WBC  --  12.3*  HGB 18.1* 17.0  HCT 52.7* 49.2  PLT  --  294   BMET No results for input(s): NA, K, CL, CO2, GLUCOSE, BUN, CREATININE, CALCIUM in the last 72 hours.   Studies/Results: No results found.  Anti-infectives: Anti-infectives    Start     Dose/Rate Route Frequency Ordered Stop   07/04/15 0612  levofloxacin (LEVAQUIN) IVPB 750 mg     750 mg 100 mL/hr over 90 Minutes Intravenous On call to O.R. 07/04/15 0612 07/04/15 0845      Assessment/Plan: s/p Procedure(s): LAPAROSCOPIC GASTRIC SLEEVE RESECTION WITH HIATAL HERNIA REPAIR WITH UPPER ENDOSCOPY Doing very well without apparent complication. Starting postop day 1 diet. It is patent discharge tomorrow.   LOS: 1 day    Ryliegh Mcduffey T 07/05/2015

## 2015-07-05 NOTE — Progress Notes (Signed)
Patient alert and oriented, Post op day 1.  Provided support and encouragement.  Encouraged pulmonary toilet, ambulation and small sips of liquids.  All questions answered.  Will continue to monitor. 

## 2015-07-05 NOTE — Progress Notes (Signed)
RT placed patient on CPAP. Patient setting is auto 10-20 cmH2O. Sterile water was added to water chamber for humidification. Patient is tolerating well at this time.RT will continue to monitor.

## 2015-07-05 NOTE — Progress Notes (Signed)
Pt not ready for cpap at this time. Will call RT when ready

## 2015-07-05 NOTE — Care Management Note (Signed)
Case Management Note  Patient Details  Name: James Patton MRN: [id number] Date of Birth: Jan 09, 1966  Subjective/Objective:           laparoscopic sleeve gastrectomy          Action/Plan: Discharge planning  Expected Discharge Date:                  Expected Discharge Plan:  Home/Self Care  In-House Referral:  NA  Discharge planning Services  CM Consult  Post Acute Care Choice:  NA Choice offered to:  NA  DME Arranged:  N/A DME Agency:  NA  HH Arranged:  NA HH Agency:  NA  Status of Service:  Completed, signed off  Medicare Important Message Given:    Date Medicare IM Given:    Medicare IM give by:    Date Additional Medicare IM Given:    Additional Medicare Important Message give by:     If discussed at Long Length of Stay Meetings, dates discussed:    Additional Comments:  Alexis Goodell, RN 07/05/2015, 11:14 AM

## 2015-07-05 NOTE — Plan of Care (Signed)
Problem: Food- and Nutrition-Related Knowledge Deficit (NB-1.1) Goal: Nutrition education Formal process to instruct or train a patient/client in a skill or to impart knowledge to help patients/clients voluntarily manage or modify food choices and eating behavior to maintain or improve health. Outcome: Completed/Met Date Met:  07/05/15 Nutrition Education Note  Received consult for diet education per DROP protocol.   Discussed 2 week post op diet with pt. Emphasized that liquids must be non carbonated, non caffeinated, and sugar free. Fluid goals discussed. Pt to follow up with outpatient bariatric RD for further diet progression after 2 weeks. Multivitamins and minerals also reviewed. Teach back method used, pt expressed understanding, expect good compliance.   Diet: First 2 Weeks  You will see the nutritionist about two (2) weeks after your surgery. The nutritionist will increase the types of foods you can eat if you are handling liquids well:  If you have severe vomiting or nausea and cannot handle clear liquids lasting longer than 1 day, call your surgeon  Protein Shake  Drink at least 2 ounces of shake 5-6 times per day  Each serving of protein shakes (usually 8 - 12 ounces) should have a minimum of:  15 grams of protein  And no more than 5 grams of carbohydrate  Goal for protein each day:  Men = 80 grams per day  Women = 60 grams per day  Protein powder may be added to fluids such as non-fat milk or Lactaid milk or Soy milk (limit to 35 grams added protein powder per serving)   Hydration  Slowly increase the amount of water and other clear liquids as tolerated (See Acceptable Fluids)  Slowly increase the amount of protein shake as tolerated  Sip fluids slowly and throughout the day  May use sugar substitutes in small amounts (no more than 6 - 8 packets per day; i.e. Splenda)   Fluid Goal  The first goal is to drink at least 8 ounces of protein shake/drink per day (or as directed  by the nutritionist); some examples of protein shakes are [employer], AMR [employer], EAS Edge HP, and Unjury. See handout from pre-op Bariatric Education Class:  Slowly increase the amount of protein shake you drink as tolerated  You may find it easier to slowly sip shakes throughout the day  It is important to get your proteins in first  Your fluid goal is to drink 64 - 100 ounces of fluid daily  It may take a few weeks to build up to this  32 oz (or more) should be clear liquids  And  32 oz (or more) should be full liquids (see below for examples)  Liquids should not contain sugar, caffeine, or carbonation   Clear Liquids:  Water or Sugar-free flavored water (i.e. Fruit H2O, Propel)  Decaffeinated coffee or tea (sugar-free)  Crystal Lite, Wyler's Lite, Minute Maid Lite  Sugar-free Jell-O  Bouillon or broth  Sugar-free Popsicle: *Less than 20 calories each; Limit 1 per day   Full Liquids:  Protein Shakes/Drinks + 2 choices per day of other full liquids  Full liquids must be:  No More Than 12 grams of Carbs per serving  No More Than 3 grams of Fat per serving  Strained low-fat cream soup  Non-Fat milk  Fat-free Lactaid Milk  Sugar-free yogurt (Dannon Lite & Fit, Greek yogurt)     Clayton Bibles, MS, RD, LDN Pager: [phone] After Hours Pager: [phone]

## 2015-07-06 ENCOUNTER — Encounter (HOSPITAL_COMMUNITY): Payer: Self-pay | Admitting: General Surgery

## 2015-07-06 LAB — CBC WITH DIFFERENTIAL/PLATELET
Basophils Absolute: 0 10*3/uL (ref 0.0–0.1)
Basophils Relative: 0 % (ref 0–1)
Eosinophils Absolute: 0.1 10*3/uL (ref 0.0–0.7)
Eosinophils Relative: 1 % (ref 0–5)
HCT: 48.3 % (ref 39.0–52.0)
Hemoglobin: 16 g/dL (ref 13.0–17.0)
Lymphocytes Relative: 20 % (ref 12–46)
Lymphs Abs: 2.1 10*3/uL (ref 0.7–4.0)
MCH: 30.9 pg (ref 26.0–34.0)
MCHC: 33.1 g/dL (ref 30.0–36.0)
MCV: 93.2 fL (ref 78.0–100.0)
Monocytes Absolute: 0.9 10*3/uL (ref 0.1–1.0)
Monocytes Relative: 9 % (ref 3–12)
Neutro Abs: 7.5 10*3/uL (ref 1.7–7.7)
Neutrophils Relative %: 70 % (ref 43–77)
Platelets: 285 10*3/uL (ref 150–400)
RBC: 5.18 MIL/uL (ref 4.22–5.81)
RDW: 15.1 % (ref 11.5–15.5)
WBC: 10.6 10*3/uL — ABNORMAL HIGH (ref 4.0–10.5)

## 2015-07-06 NOTE — Progress Notes (Signed)
Assessment unchanged. Pt and wife verbalized understanding of dc instructions through teach back regarding follow up care, activity to resume as well as when to call the doctor. Skip Estimable, RN completed bariatric teaching. Tolerated shake. Discharged via wc to front entrance to meet awaiting vehicle to carry home. Accompanied by wife and Comptroller.

## 2015-07-06 NOTE — Discharge Summary (Signed)
   Patient ID: James Patton [id number] 49 y.o. 01-03-1966  07/04/2015  Discharge date and time: 07/06/2015   Admitting Physician: Glenna Fellows T  Discharge Physician: Glenna Fellows T  Admission Diagnoses: Morbid Obesity  Discharge Diagnoses: Same  Operations: Procedure(s): LAPAROSCOPIC GASTRIC SLEEVE RESECTION WITH HIATAL HERNIA REPAIR WITH UPPER ENDOSCOPY  Admission Condition: good  Discharged Condition: good  Indication for Admission: Patient presents with progressive morbid obesity unresponsive to medical management with BMI of 49 and comorbidities of obstructive sleep apnea and hypertension and GERD. After extensive preoperative workup and discussion detailed elsewhere he is electively admitted for laparoscopic sleeve gastrectomy for treatment of his morbid obesity.  Hospital Course: On the morning of admission the patient underwent an uneventful laparoscopic sleeve gastrectomy with repair of a small hiatal hernia. His postoperative course was uncomplicated. Vital signs remained stable. He had no significant pain or nausea. Tolerated water on the first postoperative day. Hemoglobin remained normal and white count decreased toward normal. On the second postoperative day he denies pain or nausea. Tolerating protein shakes. Abdomen is soft and nontender. Incisions healing without evidence of infection. Felt ready for discharge.    Disposition: Home  Patient Instructions:    Medication List    TAKE these medications        amLODipine 5 MG tablet  Commonly known as:  NORVASC  Take 5 mg by mouth every morning.  Notes to Patient:  Monitor Blood Pressure Daily and keep a log for primary care physician.  You may need to make changes to your medications with rapid weight loss.        aspirin 81 MG tablet  Take 81 mg by mouth daily.  Notes to Patient:  Avoid NSAIDs for 6-8 weeks after surgery      benazepril 40 MG tablet  Commonly known as:  LOTENSIN  Take 40 mg by  mouth every morning.  Notes to Patient:  Monitor Blood Pressure Daily and keep a log for primary care physician.  You may need to make changes to your medications with rapid weight loss.        CIALIS 20 MG tablet  Generic drug:  tadalafil  Take 20 mg by mouth daily as needed for erectile dysfunction.     labetalol 200 MG tablet  Commonly known as:  NORMODYNE  Take 200 mg by mouth every morning.  Notes to Patient:  Monitor Blood Pressure Daily and keep a log for primary care physician.  You may need to make changes to your medications with rapid weight loss.        losartan 100 MG tablet  Commonly known as:  COZAAR  Take 100 mg by mouth every morning.  Notes to Patient:  Monitor Blood Pressure Daily and keep a log for primary care physician.  You may need to make changes to your medications with rapid weight loss.        LOTEMAX 0.5 % Gel  Generic drug:  Loteprednol Etabonate  Place 1 application into both eyes daily.     multivitamin with minerals Tabs tablet  Take 1 tablet by mouth daily.        Activity: no heavy lifting for 3 weeks Diet: bariatric protein shakes Wound Care: none needed  Follow-up:  With Dr. Johna Sheriff in 3 weeks.  Signed: Mariella Saa MD, FACS  07/06/2015, 8:38 AM

## 2015-07-06 NOTE — Progress Notes (Signed)
Patient alert and oriented, pain is controlled. Patient is tolerating fluids,  advance to protein shake today, patient tolerated well.  Reviewed Gastric sleeve discharge instructions with patient and patient is able to articulate understanding.  Provided information on BELT program, Support Group and [facility] outpatient pharmacy. All questions answered, will continue to monitor.  

## 2015-07-10 ENCOUNTER — Telehealth (HOSPITAL_COMMUNITY): Payer: Self-pay

## 2015-07-10 NOTE — Telephone Encounter (Signed)
Attempted DC call. No answer, left message to return call.  Made discharge phone call to patient per DROP protocol. Asking the following questions.    1. Do you have someone to care for you now that you are home?   2. Are you having pain now that is not relieved by your pain medication?   3. Are you able to drink the recommended daily amount of fluids (48 ounces minimum/day) and protein (60-80 grams/day) as prescribed by the dietitian or nutritional counselor?   4. Are you taking the vitamins and minerals as prescribed?   5. Do you have the "on call" number to contact your surgeon if you have a problem or question?   6. Are your incisions free of redness, swelling or drainage? (If steri strips, address that these can fall off, shower as tolerated)  7. Have your bowels moved since your surgery?  If not, are you passing gas?   8. Are you up and walking 3-4 times per day?      1. Do you have an appointment made to see your surgeon in the next month?   2. Were you provided your discharge medications before your surgery or before you were discharged from the hospital and are you taking them without problem?   3. Were you provided phone numbers to the clinic/surgeon's office?   4. Did you watch the patient education video module in the (clinic, surgeon's office, etc.) before your surgery?  5. Do you have a discharge checklist that was provided to you in the hospital to reference with instructions on how to take care of yourself after surgery?   6. Did you see a dietitian or nutritional counselor while you were in the hospital?   7. Do you have an appointment to see a dietitian or nutritional counselor in the next month?

## 2015-07-18 ENCOUNTER — Encounter: Payer: BLUE CROSS/BLUE SHIELD | Attending: General Surgery

## 2015-07-18 VITALS — Ht 71.0 in | Wt 319.5 lb

## 2015-07-18 DIAGNOSIS — Z6841 Body Mass Index (BMI) 40.0 and over, adult: Secondary | ICD-10-CM | POA: Diagnosis not present

## 2015-07-18 DIAGNOSIS — Z713 Dietary counseling and surveillance: Secondary | ICD-10-CM | POA: Diagnosis not present

## 2015-07-18 NOTE — Progress Notes (Signed)
Bariatric Class:  Appt start time: 1530 end time:  1630.  2 Week Post-Operative Nutrition Class  Patient was seen on 07/18/2015 for Post-Operative Nutrition education at [facility].   Surgery date: 07/04/2015 Surgery type: RYGB Start weight at [facility]: 346 lbs on 02/13/15 Weight today: 319.5 lbs  Weight change: 33.5 lbs  TANITA  BODY COMP RESULTS  06/05/15 07/18/15   BMI (kg/m^2) 49.2 44.6   Fat Mass (lbs) 157 138.0   Fat Free Mass (lbs) 196 181.5   Total Body Water (lbs) 143.5 133.0    The following the learning objectives were met by the patient during this course:  Identifies Phase 3A (Soft, High Proteins) Dietary Goals and will begin from 2 weeks post-operatively to 2 months post-operatively  Identifies appropriate sources of fluids and proteins   States protein recommendations and appropriate sources post-operatively  Identifies the need for appropriate texture modifications, mastication, and bite sizes when consuming solids  Identifies appropriate multivitamin and calcium sources post-operatively  Describes the need for physical activity post-operatively and will follow MD recommendations  States when to call healthcare provider regarding medication questions or post-operative complications  Handouts given during class include:  Phase 3A: Soft, High Protein Diet Handout  Follow-Up Plan: Patient will follow-up at [facility] in 6 weeks for 2 month post-op nutrition visit for diet advancement per MD.

## 2015-08-28 ENCOUNTER — Encounter: Payer: BLUE CROSS/BLUE SHIELD | Attending: General Surgery | Admitting: Dietician

## 2015-08-28 ENCOUNTER — Encounter: Payer: Self-pay | Admitting: Dietician

## 2015-08-28 VITALS — Ht 71.0 in | Wt 297.0 lb

## 2015-08-28 DIAGNOSIS — Z713 Dietary counseling and surveillance: Secondary | ICD-10-CM | POA: Diagnosis not present

## 2015-08-28 DIAGNOSIS — Z6841 Body Mass Index (BMI) 40.0 and over, adult: Secondary | ICD-10-CM | POA: Diagnosis not present

## 2015-08-28 NOTE — Patient Instructions (Addendum)
Goals:  Follow Phase 3B: High Protein + Non-Starchy Vegetables  Eat 3-6 small meals/snacks, every 3-5 hrs  Increase lean protein foods to meet 80g goal  Increase fluid intake to 64oz +  Avoid drinking 15 minutes before, during and 30 minutes after eating  Aim for >30 min of physical activity daily  Plan to start Belt program  Avoid starches carbs for now (peas, granola)  Eat protein foods first (goal to 2-3 oz per meal) and then have non starchy vegetables if you have room  Surgery date: 07/04/2015 Surgery type: RYGB [facility]: 346 lbs on 02/13/15 Weight today:297.0 lbs  Weight change: 22.5 lbs Total weight loss: 49 lbs  TANITA  BODY COMP RESULTS  06/05/15 07/18/15 08/28/15   BMI (kg/m^2) 49.2 44.6 41.4   Fat Mass (lbs) 157 138.0 121.0   Fat Free Mass (lbs) 196 181.5 176.0   Total Body Water (lbs) 143.5 133.0 129.0

## 2015-08-28 NOTE — Progress Notes (Signed)
  Follow-up visit:  8 Weeks Post-Operative RYGB Surgery  Medical Nutrition Therapy:  Appt start time: 1050 end time:  1120.  Primary concerns today: Post-operative Bariatric Surgery Nutrition Management. Returns with a 22.5 lb weight loss. Feeling good and having no problems tolerating foods.   Surgery date: 07/04/2015 Surgery type: RYGB [facility]: 346 lbs on 02/13/15 Weight today:297.0 lbs  Weight change: 22.5 lbs Total weight loss: 49 lbs  TANITA  BODY COMP RESULTS  06/05/15 07/18/15 08/28/15   BMI (kg/m^2) 49.2 44.6 41.4   Fat Mass (lbs) 157 138.0 121.0   Fat Free Mass (lbs) 196 181.5 176.0   Total Body Water (lbs) 143.5 133.0 129.0    Preferred Learning Style:   No preference indicated   Learning Readiness:   Ready  24-hr recall: B (AM): Iso 100 protein shake with 8 oz of 1% milk (39 g) Snk (AM): none L (PM): goes out to lunch - 3-4 chick fil a grilled nuggets (14 g) Snk (PM): 1/2 Quest protein bar  (10 g) D (PM): 1.5-2 oz chicken and vegetable (coleslaw, green beans, spinach), sometimes peas, baked beans (10-14 g) Snk (PM): carbmaster yogurt (10 g)  Fluid intake: 8 oz rotein shake, 30-40 oz water with flavor packets (38-48 oz) Estimated total protein intake: 83-87 g   Medications: see list Supplementation: taking, calcium just 2 x day usually   Using straws: No Drinking while eating: No Hair loss: No  Carbonated beverages: No N/V/D/C: No Dumping syndrome: had "sweats" after chicken at a [employer]  Recent physical activity:  Walking 6 x week for 15-30 minutes  Progress Towards Goal(s):  In progress.  Handouts given during visit include:  Phase 3B High Protein + Non Starchy vegetables    Nutritional Diagnosis:  [place]-3.3 Overweight/obesity related to past poor dietary habits and physical inactivity as evidenced by patient w/ recent RYGB surgery following dietary guidelines for continued weight loss.    Intervention:  Nutrition education/diet  advancement.  Goals:  Follow Phase 3B: High Protein + Non-Starchy Vegetables  Eat 3-6 small meals/snacks, every 3-5 hrs  Increase lean protein foods to meet 80g goal  Increase fluid intake to 64oz +  Avoid drinking 15 minutes before, during and 30 minutes after eating  Aim for >30 min of physical activity daily  Plan to start Belt program  Avoid starches carbs for now (peas, granola)  Eat protein foods first (goal to 2-3 oz per meal) and then have non starchy vegetables if you have room  Teaching Method Utilized:  Visual Auditory Hands on  Barriers to learning/adherence to lifestyle change: none  Demonstrated degree of understanding via:  Teach Back   Monitoring/Evaluation:  Dietary intake, exercise, and body weight. Follow up in 1 months for 3 month post-op visit.

## 2015-10-02 ENCOUNTER — Encounter: Payer: Self-pay | Admitting: Dietician

## 2015-10-02 ENCOUNTER — Encounter: Payer: BLUE CROSS/BLUE SHIELD | Attending: General Surgery | Admitting: Dietician

## 2015-10-02 VITALS — Ht 71.0 in | Wt 283.0 lb

## 2015-10-02 DIAGNOSIS — Z713 Dietary counseling and surveillance: Secondary | ICD-10-CM | POA: Insufficient documentation

## 2015-10-02 DIAGNOSIS — Z6841 Body Mass Index (BMI) 40.0 and over, adult: Secondary | ICD-10-CM | POA: Insufficient documentation

## 2015-10-02 NOTE — Progress Notes (Signed)
  Follow-up visit: 12 Weeks Post-Operative RYGB Surgery  Medical Nutrition Therapy:  Appt start time: 950 end time:  1015  Primary concerns today: Post-operative Bariatric Surgery Nutrition Management. Returns with a 14 lb weight loss. Started doing the BELT program for the past 3 weeks. Passed out at the BELT program last week from low blood pressure. Now only taking one BP medication.   Having some saltine crackers lat night for the past week.   Feeling good and having no problems tolerating foods.   Surgery date: 07/04/2015 Surgery type: RYGB [facility] lbs on 02/13/15 Weight today: 283.0 lbs lbs  Weight change: 14 lbs Total weight loss: 63 lbs  TANITA  BODY COMP RESULTS  06/05/15 07/18/15 08/28/15 10/02/15   BMI (kg/m^2) 49.2 44.6 41.4 39.5   Fat Mass (lbs) 157 138.0 121.0 97.0   Fat Free Mass (lbs) 196 181.5 176.0 186.0   Total Body Water (lbs) 143.5 133.0 129.0 136.0    Preferred Learning Style:   No preference indicated   Learning Readiness:   Ready  24-hr recall: B (AM): Iso 100 protein shake with 8 oz of 1% milk (39 g) Snk (AM): none L (PM): goes out to lunch - 3-4 chick fil a grilled nuggets or 2-3 oz steak or grilled (14-21 g) Snk (PM): 1/2 Quest protein bar  (10 g) D (PM): 2-3 oz chicken and vegetable (coleslaw, green beans, spinach), sometimes peas, baked beans (14-21 g) Snk (PM): 7 saltine crackers    Fluid intake: 8 oz protein shake, at least 68 oz water with flavor packets  Estimated total protein intake: 83-87 g   Medications: see list Supplementation: taking, calcium just 1-2 x day usually   Using straws: once in a while at lunch Drinking while eating: No, waiting 30 minutes to drink after meals Hair loss: No  Carbonated beverages: No N/V/D/C: No Dumping syndrome: No  Recent physical activity:  BELT program 3 x week  Progress Towards Goal(s):  In progress.  Handouts given during visit include:  Phase 3B High Protein + Non Starchy  vegetables    Nutritional Diagnosis:  [place]-3.3 Overweight/obesity related to past poor dietary habits and physical inactivity as evidenced by patient w/ recent RYGB surgery following dietary guidelines for continued weight loss.    Intervention:  Nutrition education/diet advancement.  Goals:  Follow Phase 3B: High Protein + Non-Starchy Vegetables  Eat 3-6 small meals/snacks, every 3-5 hrs  Increase lean protein foods to meet 80g goal  Increase fluid intake to 64oz +  Avoid drinking 15 minutes before, during and 30 minutes after eating  Aim for >30 min of physical activity daily  Continue to do start [employer]elt program  Eat protein foods first (goal to 2-3 oz per meal),  then have non starchy vegetables, if you still hungry have a small amount of carbs (crackers).   If you are craving carbs at night, have 7 cracker or up to 1 cup of low fat popcorn (Skinny Pop).  Look for Quest protein chips at [facility] or The [employer]armesan Crisps from [employer]  Work on getting 3 calcium pills per day  Teaching Method Utilized:  Visual Auditory Hands on  Barriers to learning/adherence to lifestyle change: none  Demonstrated degree of understanding via:  Teach Back   Monitoring/Evaluation:  Dietary intake, exercise, and body weight. Follow up in 3 months for 6 month post-op visit.

## 2015-10-02 NOTE — Patient Instructions (Addendum)
Goals:  Follow Phase 3B: High Protein + Non-Starchy Vegetables  Eat 3-6 small meals/snacks, every 3-5 hrs  Increase lean protein foods to meet 80g goal  Increase fluid intake to 64oz +  Avoid drinking 15 minutes before, during and 30 minutes after eating  Aim for >30 min of physical activity daily  Continue to do start [employer] Kroger[employer]elt program  Eat protein foods first (goal to 2-3 oz per meal),  then have non starchy vegetables, if you still hungry have a small amount of carbs (crackers).   If you are craving carbs at night, have 7 cracker or up to 1 cup of low fat popcorn (Skinny Pop).  Look for Quest protein chips at [facility] or Parmesan Crisps from [employer]  Work on getting 3 calcium pills per day  Surgery date: 07/04/2015 Surgery type: RYGB [facility]: 346 lbs on 02/13/15 Weight today: 283.0 lbs lbs  Weight change: 14 lbs Total weight loss: 63 lbs  TANITA  BODY COMP RESULTS  06/05/15 07/18/15 08/28/15 10/02/15   BMI (kg/m^2) 49.2 44.6 41.4 39.5   Fat Mass (lbs) 157 138.0 121.0 97.0   Fat Free Mass (lbs) 196 181.5 176.0 186.0   Total Body Water (lbs) 143.5 133.0 129.0 136.0

## 2016-01-08 ENCOUNTER — Encounter: Payer: Self-pay | Admitting: Dietician

## 2016-01-08 ENCOUNTER — Encounter: Payer: BLUE CROSS/BLUE SHIELD | Attending: General Surgery | Admitting: Dietician

## 2016-01-08 DIAGNOSIS — Z6841 Body Mass Index (BMI) 40.0 and over, adult: Secondary | ICD-10-CM | POA: Insufficient documentation

## 2016-01-08 DIAGNOSIS — Z713 Dietary counseling and surveillance: Secondary | ICD-10-CM | POA: Diagnosis not present

## 2016-01-08 NOTE — Progress Notes (Signed)
  Follow-up visit: 6 months Post-Operative Sleeve Gastrectomy Surgery  Medical Nutrition Therapy:  Appt start time: 400 end time:  430  Primary concerns today: Post-operative Bariatric Surgery Nutrition Management. Returns with a 17 lb weight loss. Feeling good about weight loss. Withdrew from the BELT program for family reasons and plans to resume. Tried salad recently and is tolerating. Feeling satisfied with what he is eating. Always has a protein shake and/or a Fit Joy bar per day to ensure that he meets his protein needs.   Surgery date: 07/04/2015 Surgery type: Sleeve gastrectomy [facility]: 346 lbs on 02/13/15 Weight today: 266 lbs Weight change: 17 lbs Total weight loss: 80 lbs  TANITA  BODY COMP RESULTS  06/05/15 07/18/15 08/28/15 10/02/15 01/08/16   BMI (kg/m^2) 49.2 44.6 41.4 39.5 37.2   Fat Mass (lbs) 157 138.0 121.0 97.0 81.5   Fat Free Mass (lbs) 196 181.5 176.0 186.0 185   Total Body Water (lbs) 143.5 133.0 129.0 136.0 135.5    Preferred Learning Style:   No preference indicated   Learning Readiness:   Ready  24-hr recall: B (AM): sausage, 1 egg, and sometimes Carb-Master yogurt (14-21g) Snk (AM): sometimes beef jerky and saltine crackers (0-7g) L (PM): 2-3 oz steak or grilled chicken (14-21 g) Snk (PM): Fit Joy bar (20g) D (PM): 2-3 oz chicken, salad or zucchini, rarely some mashed potatoes (21g) Snk (PM): Smart popcorn  Fluid intake: 8 oz protein shake, at least 68 oz water with flavor packets  Estimated total protein intake: ~80 g   Medications: see list; has doctor appt next week Supplementation: taking, calcium just 1-2 x day usually   Using straws: once in a while at lunch Drinking while eating: No, waiting 30 minutes to drink after meals Hair loss: No  Carbonated beverages: No N/V/D/C: No Dumping syndrome: No  Recent physical activity:  Walking on treadmill 1-2x a week for 30-45 minutes; plans to resume BELT program  Progress Towards  Goal(s):  In progress.  Handouts given during visit include:  none   Nutritional Diagnosis:  Joanna-3.3 Overweight/obesity related to past poor dietary habits and physical inactivity as evidenced by patient w/ recent RYGB surgery following dietary guidelines for continued weight loss.    Intervention:  Nutrition education/diet advancement.  Goals:  Follow Phase 3B: High Protein + Non-Starchy Vegetables  Eat 3-6 small meals/snacks, every 3-5 hrs  Increase lean protein foods to meet 80g goal  Increase fluid intake to 64oz +  Avoid drinking 15 minutes before, during and 30 minutes after eating  Aim for >30 min of physical activity daily  Continue to do start [employer] program  Eat protein foods first (goal to 2-3 oz per meal),  then have non starchy vegetables, if you still hungry have a small amount of carbs (crackers).   If you are craving carbs at night, have 7 cracker or up to 1 cup of low fat popcorn (Skinny Pop).  Look for [facility] protein chips at [facility] or [employer] from [employer]  Work on getting 3 calcium pills per day  Teaching Method Utilized:  Visual Auditory Hands on  Barriers to learning/adherence to lifestyle change: none  Demonstrated degree of understanding via:  Teach Back   Monitoring/Evaluation:  Dietary intake, exercise, and body weight. Follow up in 6 months for 12 month post-op visit.

## 2016-01-08 NOTE — Patient Instructions (Signed)
Goals:  Follow Phase 3B: High Protein + Non-Starchy Vegetables  Eat 3-6 small meals/snacks, every 3-5 hrs  Increase lean protein foods to meet 80g goal  Increase fluid intake to 64oz +  Avoid drinking 15 minutes before, during and 30 minutes after eating  Aim for >30 min of physical activity daily  Continue to do start [employer] program  Eat protein foods first (goal to 2-3 oz per meal),  then have non starchy vegetables, if you still hungry have a small amount of carbs (crackers).   If you are craving carbs at night, have 7 cracker or up to 1 cup of low fat popcorn (Skinny Pop).  Look for [facility] protein chips at [facility] or Parmesan Crisps from [employer]  Work on getting 3 calcium pills per day  Surgery date: 07/04/2015 Surgery type: Sleeve gastrectomy [facility] lbs on 02/13/15 Weight today: 266 lbs Weight change: 17 lbs Total weight loss: 80 lbs  TANITA  BODY COMP RESULTS  06/05/15 07/18/15 08/28/15 10/02/15 01/08/16   BMI (kg/m^2) 49.2 44.6 41.4 39.5 37.2   Fat Mass (lbs) 157 138.0 121.0 97.0 81.5   Fat Free Mass (lbs) 196 181.5 176.0 186.0 185   Total Body Water (lbs) 143.5 133.0 129.0 136.0 135.5

## 2016-06-16 IMAGING — US US ABDOMEN COMPLETE
1 series · 14 of 25 positions shown · non-contrast
Comparison: None.

CLINICAL DATA: Preop screening.  Morbid obesity.

EXAM:
ULTRASOUND ABDOMEN COMPLETE

[Series 1: us abdomen complete · 14 of 80 slices shown]
[im 1/80]
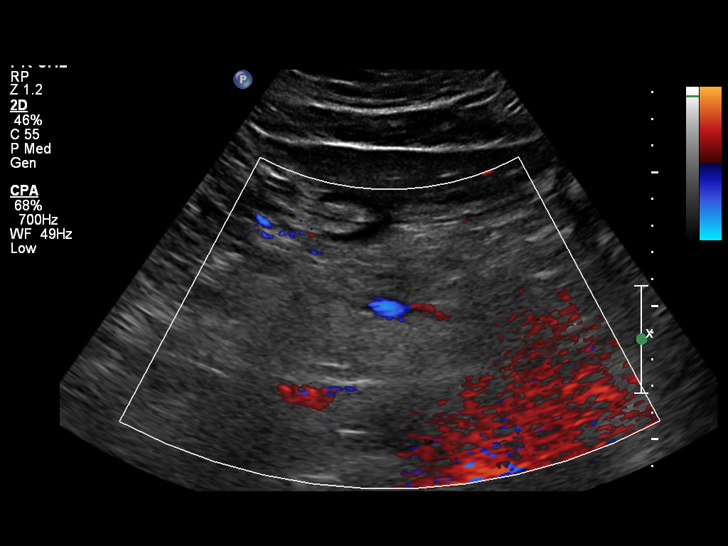
[im 7/80]
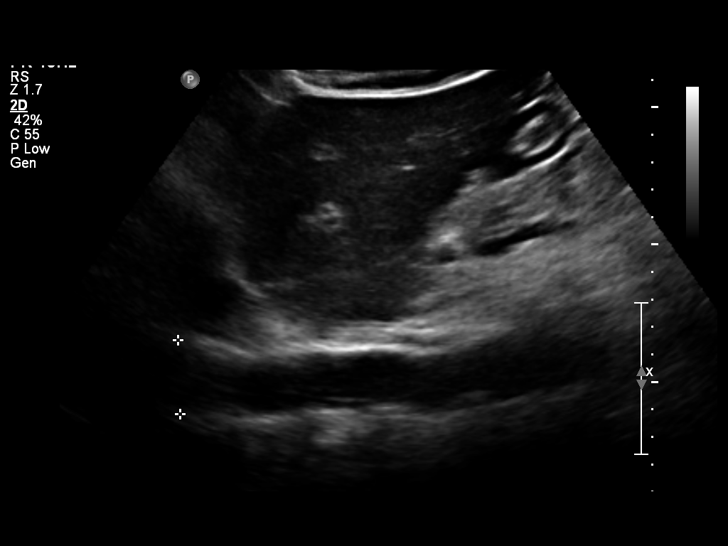
[im 14/80]
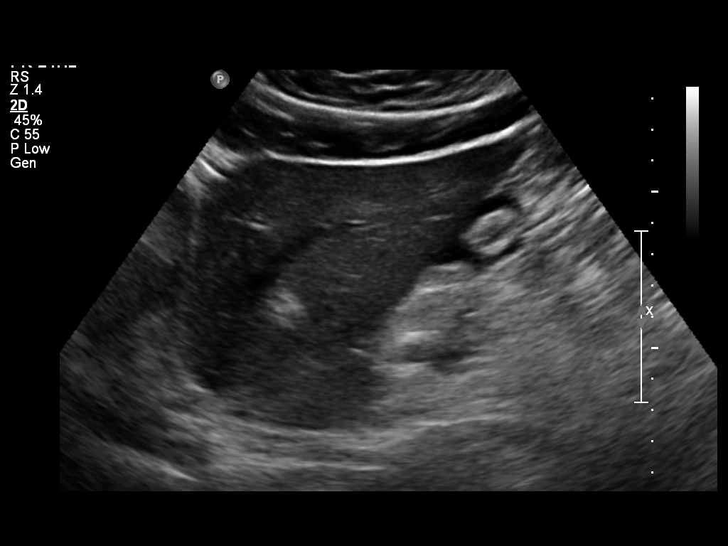
[im 20/80]
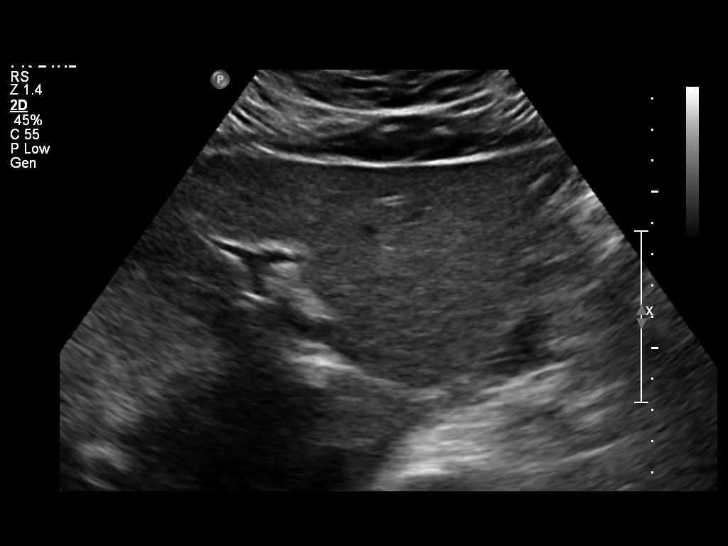
[im 27/80]
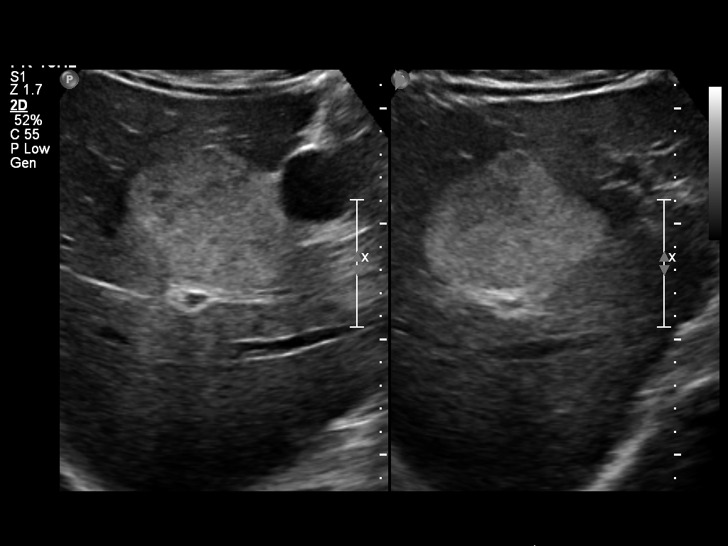
[im 30/80]
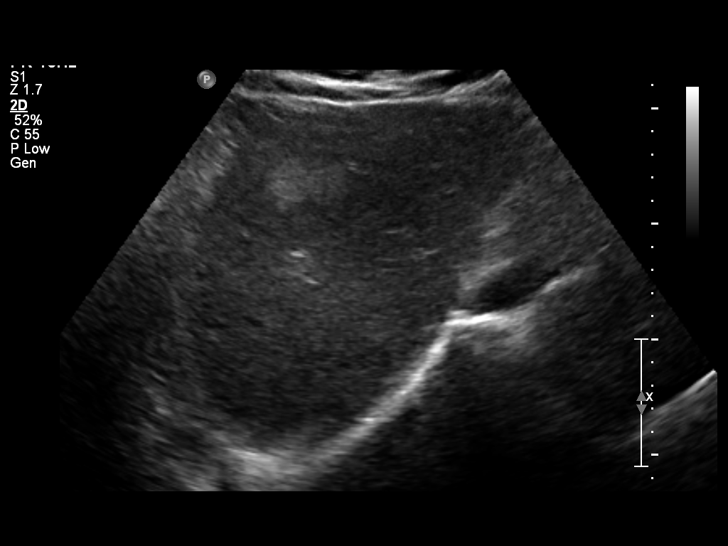
[im 37/80]
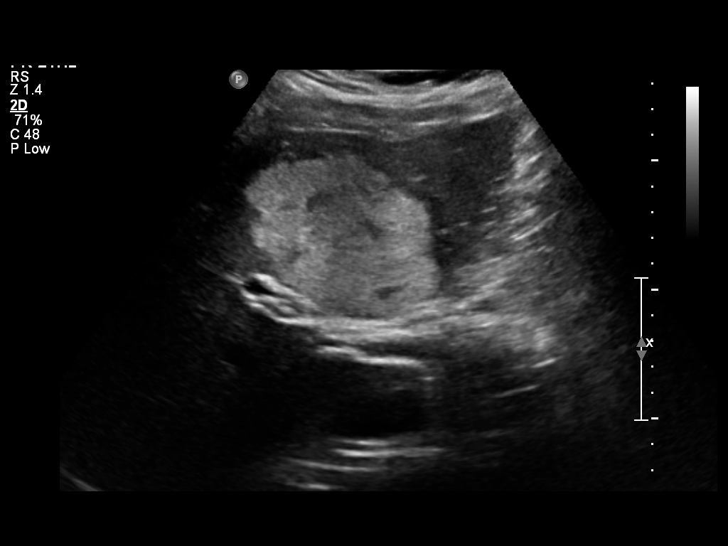
[im 43/80]
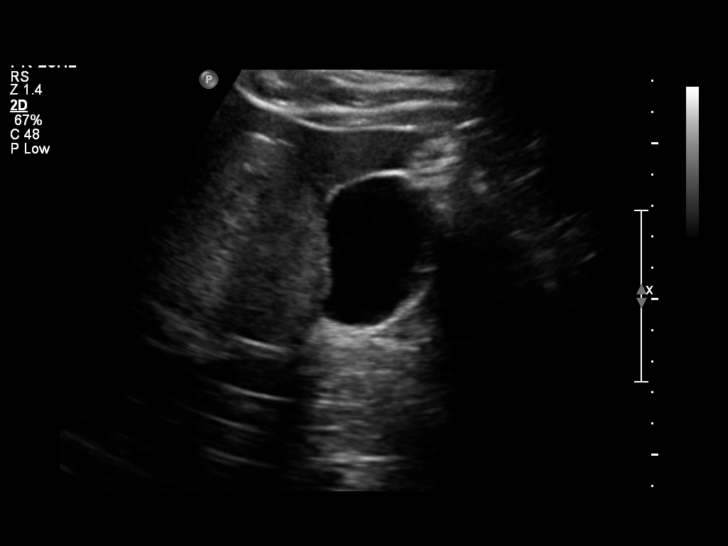
[im 50/80]
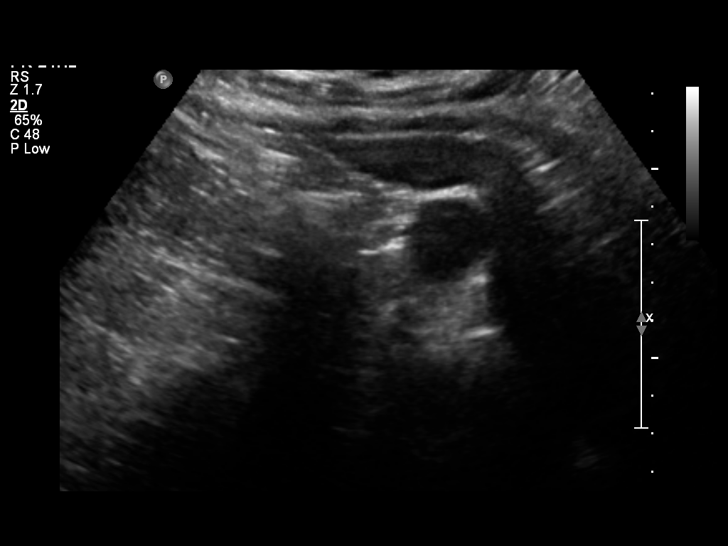
[im 53/80]
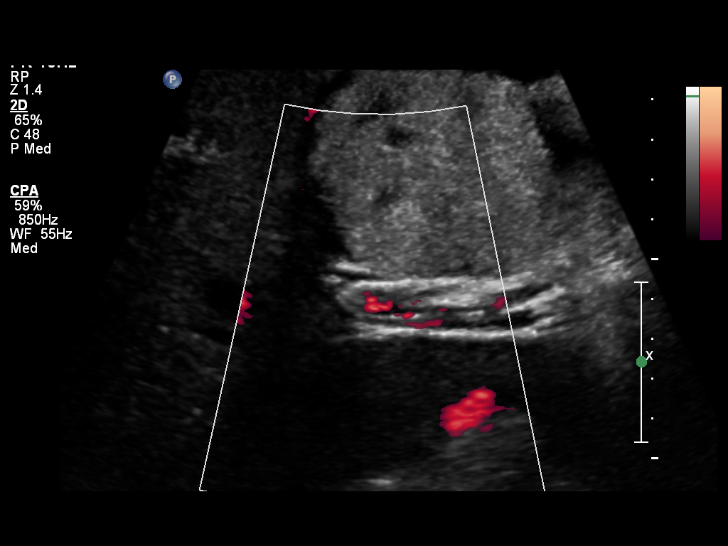
[im 60/80]
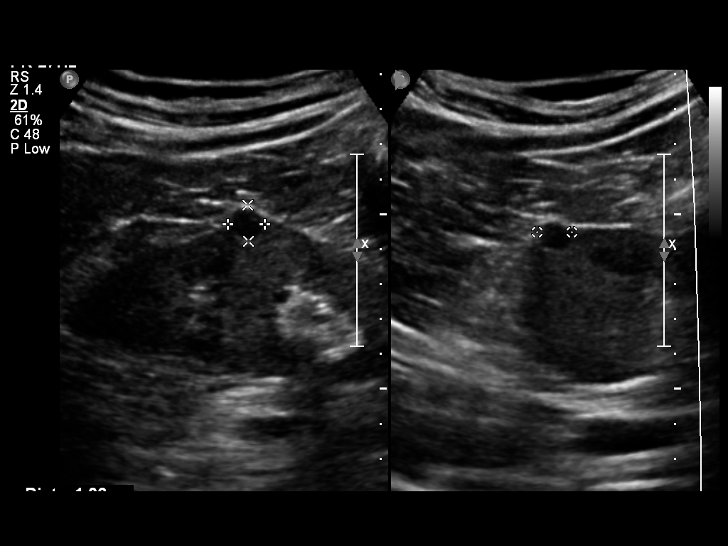
[im 66/80]
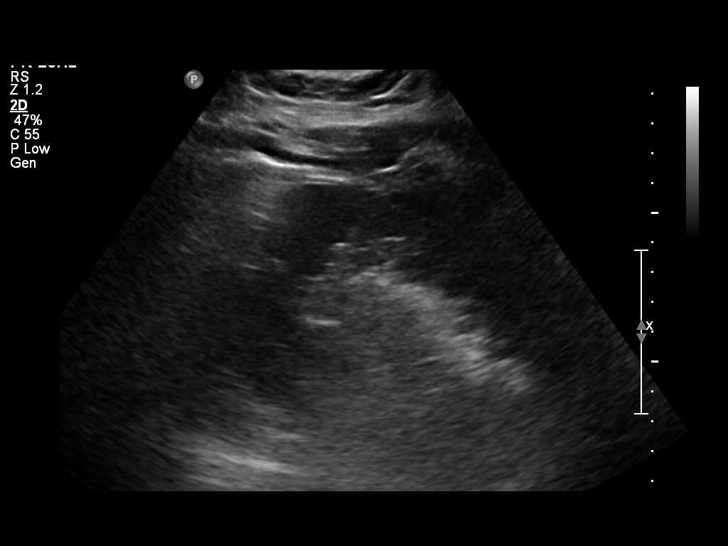
[im 73/80]
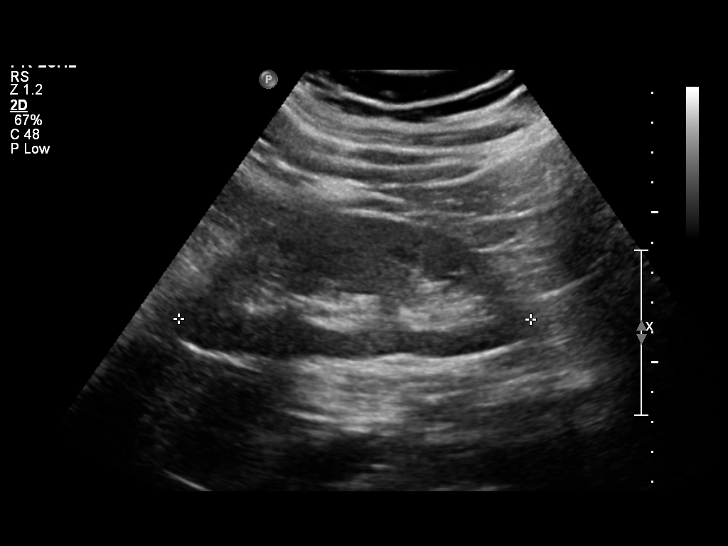
[im 80/80]
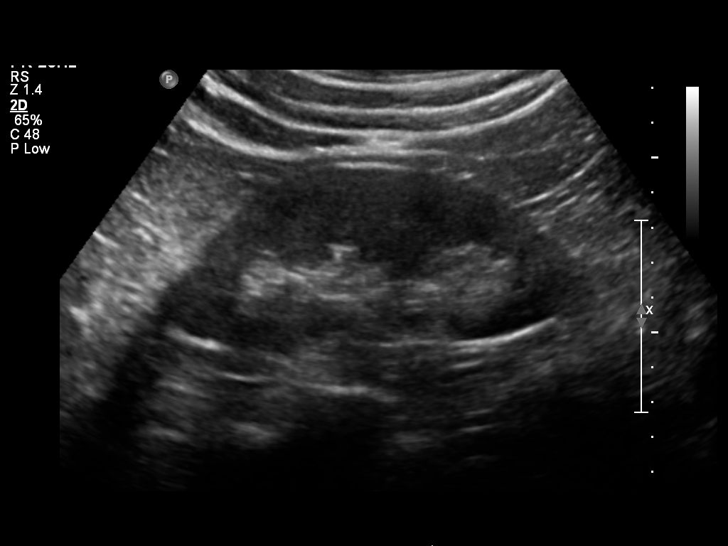

[14 of 25 positions shown; findings below may reference images not displayed]

FINDINGS: Gallbladder: No gallstones or wall thickening visualized. No
sonographic Murphy sign noted.

Common bile duct: Diameter: Normal caliber, 3 mm.

Liver: Large echogenic mass within the right lobe measuring 8.5 x
7.7 x 5.6 cm. No other focal abnormality. Echotexture otherwise
normal. No biliary ductal dilatation.

IVC: No abnormality visualized.

Pancreas: Visualized portion unremarkable.

Spleen: Size and appearance within normal limits.

Right Kidney: Length: 11.2 cm. Echogenicity within normal limits. No
mass or hydronephrosis visualized. Small lateral simple appearing
cyst measures 11 mm.

Left Kidney: Length: 11.7 cm. Echogenicity within normal limits. No
mass or hydronephrosis visualized.

Abdominal aorta: No aneurysm visualized.

Other findings: None.
IMPRESSION: 8.5 cm echogenic mass within the liver. This may represent a giant
hemangioma. Recommend further evaluation with MRI with and without
contrast.

## 2016-06-16 IMAGING — DX DG CHEST 2V
2 series · 2 of 2 positions shown · non-contrast
Comparison: None.

CLINICAL DATA: Bariatric screening.

EXAM:
CHEST  2 VIEW

[chest pa]
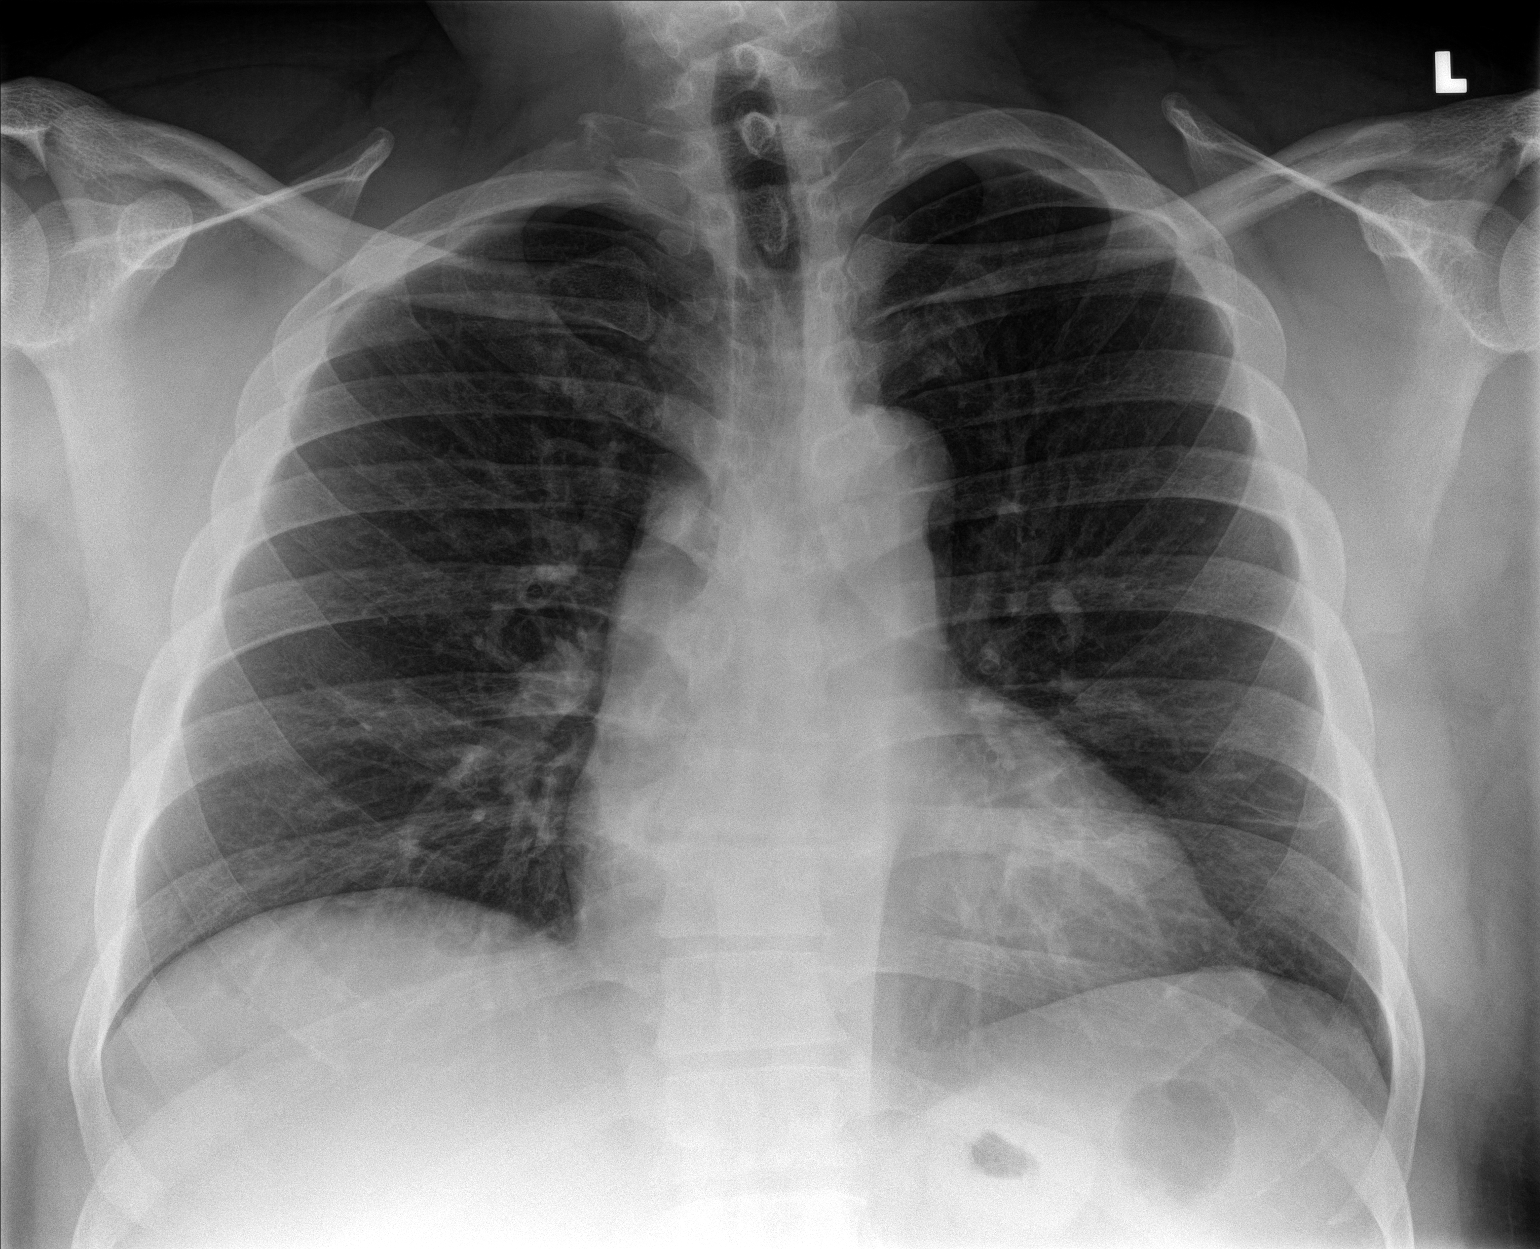

[chest lat]
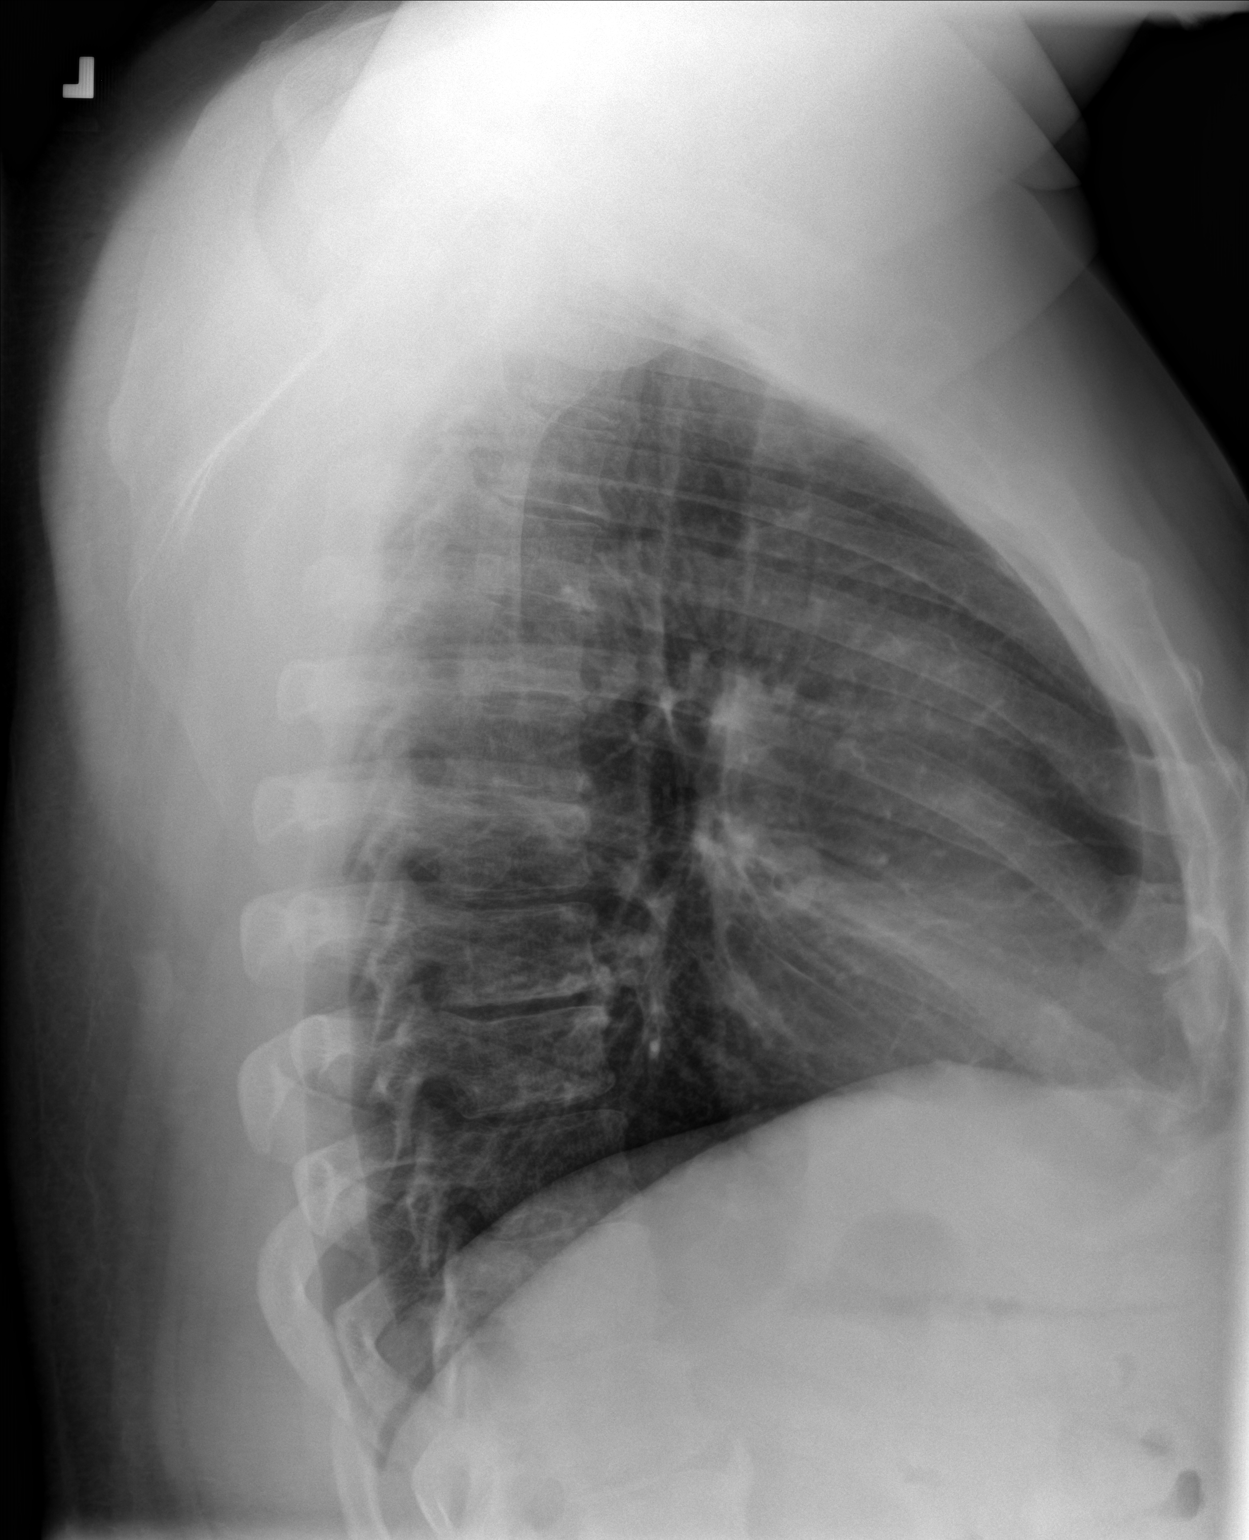

[2 of 2 positions shown; findings below may reference images not displayed]

FINDINGS: The heart size and mediastinal contours are within normal limits.
Both lungs are clear. The visualized skeletal structures are
unremarkable.
IMPRESSION: No active cardiopulmonary disease.

## 2016-07-08 ENCOUNTER — Ambulatory Visit: Payer: BLUE CROSS/BLUE SHIELD | Admitting: Dietician

## 2016-08-22 ENCOUNTER — Institutional Professional Consult (permissible substitution): Payer: BLUE CROSS/BLUE SHIELD | Admitting: Neurology

## 2016-08-29 ENCOUNTER — Encounter: Payer: Self-pay | Admitting: Neurology

## 2016-09-30 ENCOUNTER — Institutional Professional Consult (permissible substitution): Payer: BLUE CROSS/BLUE SHIELD | Admitting: Neurology

## 2017-02-04 ENCOUNTER — Encounter (HOSPITAL_COMMUNITY): Payer: Self-pay

## 2018-02-06 ENCOUNTER — Encounter (HOSPITAL_COMMUNITY): Payer: Self-pay

## 2021-01-27 ENCOUNTER — Telehealth: Payer: Self-pay | Admitting: Unknown Physician Specialty

## 2021-01-27 NOTE — Telephone Encounter (Signed)
Called to discuss with patient about COVID-19 symptoms and the use of one of the available treatments for those with mild to moderate Covid symptoms and at a high risk of hospitalization.  Pt appears to qualify for outpatient treatment due to co-morbid conditions and/or a member of an at-risk group in accordance with the FDA Emergency Use Authorization.    Symptom onset: 3/27 Vaccinated: no Booster? no Immunocompromised? no Qualifiers: obesity, race  Feeling better.  Outside of range for orals.  Hotline given in case sxs are not continuing to improve to consider infusion on Wednesday  [facility]

## 2021-11-01 DIAGNOSIS — I129 Hypertensive chronic kidney disease with stage 1 through stage 4 chronic kidney disease, or unspecified chronic kidney disease: Secondary | ICD-10-CM | POA: Diagnosis not present

## 2021-11-01 DIAGNOSIS — N1831 Chronic kidney disease, stage 3a: Secondary | ICD-10-CM | POA: Diagnosis not present

## 2022-01-10 DIAGNOSIS — H35033 Hypertensive retinopathy, bilateral: Secondary | ICD-10-CM | POA: Diagnosis not present

## 2022-01-10 DIAGNOSIS — H16223 Keratoconjunctivitis sicca, not specified as Sjogren's, bilateral: Secondary | ICD-10-CM | POA: Diagnosis not present

## 2022-01-10 DIAGNOSIS — H0288A Meibomian gland dysfunction right eye, upper and lower eyelids: Secondary | ICD-10-CM | POA: Diagnosis not present

## 2022-01-10 DIAGNOSIS — H02535 Eyelid retraction left lower eyelid: Secondary | ICD-10-CM | POA: Diagnosis not present

## 2022-01-10 DIAGNOSIS — H0288B Meibomian gland dysfunction left eye, upper and lower eyelids: Secondary | ICD-10-CM | POA: Diagnosis not present

## 2022-01-10 DIAGNOSIS — H0102B Squamous blepharitis left eye, upper and lower eyelids: Secondary | ICD-10-CM | POA: Diagnosis not present

## 2022-01-10 DIAGNOSIS — H02203 Unspecified lagophthalmos right eye, unspecified eyelid: Secondary | ICD-10-CM | POA: Diagnosis not present

## 2022-01-10 DIAGNOSIS — H02532 Eyelid retraction right lower eyelid: Secondary | ICD-10-CM | POA: Diagnosis not present

## 2022-01-10 DIAGNOSIS — H02206 Unspecified lagophthalmos left eye, unspecified eyelid: Secondary | ICD-10-CM | POA: Diagnosis not present

## 2022-01-10 DIAGNOSIS — H0102A Squamous blepharitis right eye, upper and lower eyelids: Secondary | ICD-10-CM | POA: Diagnosis not present

## 2022-04-26 DIAGNOSIS — H00014 Hordeolum externum left upper eyelid: Secondary | ICD-10-CM | POA: Diagnosis not present

## 2022-05-13 DIAGNOSIS — H00014 Hordeolum externum left upper eyelid: Secondary | ICD-10-CM | POA: Diagnosis not present

## 2022-05-13 DIAGNOSIS — I10 Essential (primary) hypertension: Secondary | ICD-10-CM | POA: Diagnosis not present

## 2022-05-13 DIAGNOSIS — N5201 Erectile dysfunction due to arterial insufficiency: Secondary | ICD-10-CM | POA: Diagnosis not present

## 2022-05-17 DIAGNOSIS — N1831 Chronic kidney disease, stage 3a: Secondary | ICD-10-CM | POA: Diagnosis not present

## 2022-05-17 DIAGNOSIS — I129 Hypertensive chronic kidney disease with stage 1 through stage 4 chronic kidney disease, or unspecified chronic kidney disease: Secondary | ICD-10-CM | POA: Diagnosis not present

## 2022-05-22 DIAGNOSIS — M545 Low back pain, unspecified: Secondary | ICD-10-CM | POA: Diagnosis not present

## 2022-05-26 ENCOUNTER — Emergency Department (HOSPITAL_BASED_OUTPATIENT_CLINIC_OR_DEPARTMENT_OTHER): Payer: 59

## 2022-05-26 ENCOUNTER — Other Ambulatory Visit: Payer: Self-pay

## 2022-05-26 ENCOUNTER — Emergency Department (HOSPITAL_BASED_OUTPATIENT_CLINIC_OR_DEPARTMENT_OTHER)
Admission: EM | Admit: 2022-05-26 | Discharge: 2022-05-26 | Disposition: A | Discharge status: Home / Self Care | Payer: 59 | Attending: Emergency Medicine | Admitting: Emergency Medicine

## 2022-05-26 DIAGNOSIS — M542 Cervicalgia: Secondary | ICD-10-CM | POA: Diagnosis not present

## 2022-05-26 DIAGNOSIS — R079 Chest pain, unspecified: Secondary | ICD-10-CM | POA: Diagnosis not present

## 2022-05-26 DIAGNOSIS — R519 Headache, unspecified: Secondary | ICD-10-CM | POA: Diagnosis not present

## 2022-05-26 DIAGNOSIS — M62830 Muscle spasm of back: Secondary | ICD-10-CM | POA: Diagnosis not present

## 2022-05-26 DIAGNOSIS — R0789 Other chest pain: Secondary | ICD-10-CM | POA: Diagnosis not present

## 2022-05-26 DIAGNOSIS — M545 Low back pain, unspecified: Secondary | ICD-10-CM | POA: Insufficient documentation

## 2022-05-26 DIAGNOSIS — M62838 Other muscle spasm: Secondary | ICD-10-CM | POA: Diagnosis not present

## 2022-05-26 DIAGNOSIS — R109 Unspecified abdominal pain: Secondary | ICD-10-CM | POA: Diagnosis not present

## 2022-05-26 DIAGNOSIS — R202 Paresthesia of skin: Secondary | ICD-10-CM | POA: Diagnosis not present

## 2022-05-26 DIAGNOSIS — Y92410 Unspecified street and highway as the place of occurrence of the external cause: Secondary | ICD-10-CM | POA: Insufficient documentation

## 2022-05-26 LAB — CBC WITH DIFFERENTIAL/PLATELET
Abs Immature Granulocytes: 0.03 10*3/uL (ref 0.00–0.07)
Basophils Absolute: 0.1 10*3/uL (ref 0.0–0.1)
Basophils Relative: 1 %
Eosinophils Absolute: 0.2 10*3/uL (ref 0.0–0.5)
Eosinophils Relative: 3 %
HCT: 47.9 % (ref 39.0–52.0)
Hemoglobin: 16.4 g/dL (ref 13.0–17.0)
Immature Granulocytes: 0 %
Lymphocytes Relative: 28 %
Lymphs Abs: 2.3 10*3/uL (ref 0.7–4.0)
MCH: 32.7 pg (ref 26.0–34.0)
MCHC: 34.2 g/dL (ref 30.0–36.0)
MCV: 95.4 fL (ref 80.0–100.0)
Monocytes Absolute: 0.8 10*3/uL (ref 0.1–1.0)
Monocytes Relative: 10 %
Neutro Abs: 4.7 10*3/uL (ref 1.7–7.7)
Neutrophils Relative %: 58 %
Platelets: 238 10*3/uL (ref 150–400)
RBC: 5.02 MIL/uL (ref 4.22–5.81)
RDW: 13.8 % (ref 11.5–15.5)
WBC: 8.1 10*3/uL (ref 4.0–10.5)
nRBC: 0 % (ref 0.0–0.2)

## 2022-05-26 LAB — COMPREHENSIVE METABOLIC PANEL
ALT: 13 U/L (ref 0–44)
AST: 15 U/L (ref 15–41)
Albumin: 4.1 g/dL (ref 3.5–5.0)
Alkaline Phosphatase: 49 U/L (ref 38–126)
Anion gap: 10 (ref 5–15)
BUN: 22 mg/dL — ABNORMAL HIGH (ref 6–20)
CO2: 32 mmol/L (ref 22–32)
Calcium: 9.4 mg/dL (ref 8.9–10.3)
Chloride: 99 mmol/L (ref 98–111)
Creatinine, Ser: 1.72 mg/dL — ABNORMAL HIGH (ref 0.61–1.24)
GFR, Estimated: 47 mL/min — ABNORMAL LOW (ref >60)
Glucose, Bld: 90 mg/dL (ref 70–99)
Potassium: 3.7 mmol/L (ref 3.5–5.1)
Sodium: 141 mmol/L (ref 135–145)
Total Bilirubin: 0.6 mg/dL (ref 0.3–1.2)
Total Protein: 6.7 g/dL (ref 6.5–8.1)

## 2022-05-26 LAB — URINALYSIS, ROUTINE W REFLEX MICROSCOPIC
Bilirubin Urine: NEGATIVE
Glucose, UA: NEGATIVE mg/dL
Hgb urine dipstick: NEGATIVE
Ketones, ur: NEGATIVE mg/dL
Leukocytes,Ua: NEGATIVE
Nitrite: NEGATIVE
Protein, ur: NEGATIVE mg/dL
Specific Gravity, Urine: 1.026 (ref 1.005–1.030)
pH: 7 (ref 5.0–8.0)

## 2022-05-26 LAB — TROPONIN I (HIGH SENSITIVITY)
Troponin I (High Sensitivity): 30 ng/L — ABNORMAL HIGH (ref <18)
Troponin I (High Sensitivity): 24 ng/L — ABNORMAL HIGH (ref <18)

## 2022-05-26 LAB — LIPASE, BLOOD: Lipase: 30 U/L (ref 11–51)

## 2022-05-26 LAB — LACTIC ACID, PLASMA: Lactic Acid, Venous: 1.3 mmol/L (ref 0.5–1.9)

## 2022-05-26 MED ORDER — LIDOCAINE 5 % EX PTCH: 1.0000 | MEDICATED_PATCH | CUTANEOUS | 0 refills | Status: AC

## 2022-05-26 MED ORDER — IOHEXOL 300 MG/ML  SOLN
100.0000 mL | Freq: Once | INTRAMUSCULAR | Status: AC | PRN
  Administered 2022-05-26: 80 mL via INTRAVENOUS

## 2022-05-26 MED ORDER — CYCLOBENZAPRINE HCL 10 MG PO TABS: 10.0000 mg | ORAL_TABLET | Freq: Two times a day (BID) | ORAL | 0 refills | Status: AC | PRN
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
## 2022-05-26 NOTE — ED Notes (Signed)
Discharge paperwork given and understood. 
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
## 2022-06-10 DIAGNOSIS — R202 Paresthesia of skin: Secondary | ICD-10-CM | POA: Diagnosis not present

## 2022-06-11 DIAGNOSIS — M5489 Other dorsalgia: Secondary | ICD-10-CM | POA: Diagnosis not present

## 2022-06-11 DIAGNOSIS — Z6841 Body Mass Index (BMI) 40.0 and over, adult: Secondary | ICD-10-CM | POA: Diagnosis not present

## 2022-06-22 DIAGNOSIS — H1031 Unspecified acute conjunctivitis, right eye: Secondary | ICD-10-CM | POA: Diagnosis not present

## 2022-11-13 DIAGNOSIS — I129 Hypertensive chronic kidney disease with stage 1 through stage 4 chronic kidney disease, or unspecified chronic kidney disease: Secondary | ICD-10-CM | POA: Diagnosis not present

## 2022-11-13 DIAGNOSIS — I10 Essential (primary) hypertension: Secondary | ICD-10-CM | POA: Diagnosis not present

## 2022-11-26 DIAGNOSIS — N1831 Chronic kidney disease, stage 3a: Secondary | ICD-10-CM | POA: Diagnosis not present

## 2022-11-26 DIAGNOSIS — I129 Hypertensive chronic kidney disease with stage 1 through stage 4 chronic kidney disease, or unspecified chronic kidney disease: Secondary | ICD-10-CM | POA: Diagnosis not present

## 2023-01-23 DIAGNOSIS — I129 Hypertensive chronic kidney disease with stage 1 through stage 4 chronic kidney disease, or unspecified chronic kidney disease: Secondary | ICD-10-CM | POA: Diagnosis not present

## 2023-01-23 DIAGNOSIS — Z880 Allergy status to penicillin: Secondary | ICD-10-CM | POA: Diagnosis not present

## 2023-01-23 DIAGNOSIS — N529 Male erectile dysfunction, unspecified: Secondary | ICD-10-CM | POA: Diagnosis not present

## 2023-01-23 DIAGNOSIS — Z87891 Personal history of nicotine dependence: Secondary | ICD-10-CM | POA: Diagnosis not present

## 2023-01-23 DIAGNOSIS — Z820 Family history of epilepsy and other diseases of the nervous system: Secondary | ICD-10-CM | POA: Diagnosis not present

## 2023-01-23 DIAGNOSIS — Z6841 Body Mass Index (BMI) 40.0 and over, adult: Secondary | ICD-10-CM | POA: Diagnosis not present

## 2023-01-23 DIAGNOSIS — N189 Chronic kidney disease, unspecified: Secondary | ICD-10-CM | POA: Diagnosis not present

## 2023-03-26 DIAGNOSIS — I10 Essential (primary) hypertension: Secondary | ICD-10-CM | POA: Diagnosis not present

## 2023-03-26 DIAGNOSIS — Z6841 Body Mass Index (BMI) 40.0 and over, adult: Secondary | ICD-10-CM | POA: Diagnosis not present

## 2023-05-15 DIAGNOSIS — I10 Essential (primary) hypertension: Secondary | ICD-10-CM | POA: Diagnosis not present

## 2023-05-15 DIAGNOSIS — R5383 Other fatigue: Secondary | ICD-10-CM | POA: Diagnosis not present

## 2023-05-15 DIAGNOSIS — G4733 Obstructive sleep apnea (adult) (pediatric): Secondary | ICD-10-CM | POA: Diagnosis not present

## 2023-05-15 DIAGNOSIS — N5201 Erectile dysfunction due to arterial insufficiency: Secondary | ICD-10-CM | POA: Diagnosis not present

## 2023-05-15 DIAGNOSIS — Z6841 Body Mass Index (BMI) 40.0 and over, adult: Secondary | ICD-10-CM | POA: Diagnosis not present

## 2023-06-16 DIAGNOSIS — N183 Chronic kidney disease, stage 3 unspecified: Secondary | ICD-10-CM | POA: Diagnosis not present

## 2023-06-16 DIAGNOSIS — D631 Anemia in chronic kidney disease: Secondary | ICD-10-CM | POA: Diagnosis not present

## 2023-06-16 DIAGNOSIS — N1831 Chronic kidney disease, stage 3a: Secondary | ICD-10-CM | POA: Diagnosis not present

## 2023-06-16 DIAGNOSIS — I129 Hypertensive chronic kidney disease with stage 1 through stage 4 chronic kidney disease, or unspecified chronic kidney disease: Secondary | ICD-10-CM | POA: Diagnosis not present

## 2023-06-16 DIAGNOSIS — N2581 Secondary hyperparathyroidism of renal origin: Secondary | ICD-10-CM | POA: Diagnosis not present
